# Patient Record
Sex: Male | Born: 1985 | Race: Asian | Hispanic: No | Marital: Single | Smoking: Never smoker
Health system: Southern US, Community
[De-identification: ages and names within clinical notes are randomized; demographics above are authoritative.]

## PROBLEM LIST (undated history)

## (undated) DIAGNOSIS — F32A Depression, unspecified: Secondary | ICD-10-CM

## (undated) DIAGNOSIS — T7840XA Allergy, unspecified, initial encounter: Secondary | ICD-10-CM

## (undated) DIAGNOSIS — F329 Major depressive disorder, single episode, unspecified: Secondary | ICD-10-CM

## (undated) HISTORY — DX: Allergy, unspecified, initial encounter: T78.40XA

## (undated) HISTORY — DX: Depression, unspecified: F32.A

## (undated) HISTORY — DX: Major depressive disorder, single episode, unspecified: F32.9

---
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
## 2009-11-23 ENCOUNTER — Emergency Department (HOSPITAL_COMMUNITY): Admission: EM | Admit: 2009-11-23 | Discharge: 2009-11-23 | Payer: Self-pay | Admitting: Emergency Medicine

## 2012-08-27 ENCOUNTER — Emergency Department (HOSPITAL_COMMUNITY)
Admission: EM | Admit: 2012-08-27 | Discharge: 2012-08-27 | Discharge status: Against Medical Advice | Payer: BC Managed Care – PPO | Attending: Emergency Medicine | Admitting: Emergency Medicine

## 2012-08-27 ENCOUNTER — Encounter (HOSPITAL_COMMUNITY): Payer: Self-pay | Admitting: Emergency Medicine

## 2012-08-27 DIAGNOSIS — F3289 Other specified depressive episodes: Secondary | ICD-10-CM | POA: Insufficient documentation

## 2012-08-27 DIAGNOSIS — F329 Major depressive disorder, single episode, unspecified: Secondary | ICD-10-CM | POA: Insufficient documentation

## 2012-08-27 DIAGNOSIS — F32A Depression, unspecified: Secondary | ICD-10-CM

## 2012-08-27 LAB — COMPREHENSIVE METABOLIC PANEL
ALT: 35 U/L (ref 0–53)
AST: 24 U/L (ref 0–37)
Albumin: 4.7 g/dL (ref 3.5–5.2)
Alkaline Phosphatase: 70 U/L (ref 39–117)
BUN: 13 mg/dL (ref 6–23)
CO2: 28 mEq/L (ref 19–32)
Calcium: 9.8 mg/dL (ref 8.4–10.5)
Chloride: 100 mEq/L (ref 96–112)
Creatinine, Ser: 0.88 mg/dL (ref 0.50–1.35)
GFR calc Af Amer: >90 mL/min (ref >90)
GFR calc non Af Amer: >90 mL/min (ref >90)
Glucose, Bld: 99 mg/dL (ref 70–99)
Potassium: 3.9 mEq/L (ref 3.5–5.1)
Sodium: 139 mEq/L (ref 135–145)
Total Bilirubin: 0.4 mg/dL (ref 0.3–1.2)
Total Protein: 8.3 g/dL (ref 6.0–8.3)

## 2012-08-27 LAB — ACETAMINOPHEN LEVEL: Acetaminophen (Tylenol), Serum: <15 ug/mL (ref 10–30)

## 2012-08-27 LAB — CBC
HCT: 43 % (ref 39.0–52.0)
Hemoglobin: 14.9 g/dL (ref 13.0–17.0)
MCH: 28.4 pg (ref 26.0–34.0)
MCHC: 34.7 g/dL (ref 30.0–36.0)
MCV: 81.9 fL (ref 78.0–100.0)
Platelets: 219 10*3/uL (ref 150–400)
RBC: 5.25 MIL/uL (ref 4.22–5.81)
RDW: 12.3 % (ref 11.5–15.5)
WBC: 6 10*3/uL (ref 4.0–10.5)

## 2012-08-27 LAB — RAPID URINE DRUG SCREEN, HOSP PERFORMED
Amphetamines: NOT DETECTED
Barbiturates: NOT DETECTED
Benzodiazepines: NOT DETECTED
Cocaine: NOT DETECTED
Opiates: NOT DETECTED
Tetrahydrocannabinol: NOT DETECTED

## 2012-08-27 LAB — ETHANOL: Alcohol, Ethyl (B): <11 mg/dL (ref 0–11)

## 2012-08-27 LAB — SALICYLATE LEVEL: Salicylate Lvl: <2 mg/dL — ABNORMAL LOW (ref 2.8–20.0)

## 2012-08-27 MED ORDER — ACETAMINOPHEN 325 MG PO TABS: 650.0000 mg | ORAL_TABLET | ORAL | Status: DC | PRN

## 2012-08-27 MED ORDER — ONDANSETRON HCL 4 MG PO TABS: 4.0000 mg | ORAL_TABLET | Freq: Three times a day (TID) | ORAL | Status: DC | PRN

## 2012-08-27 MED ORDER — ALUM & MAG HYDROXIDE-SIMETH 200-200-20 MG/5ML PO SUSP: 30.0000 mL | ORAL | Status: DC | PRN

## 2012-08-27 MED ORDER — LORAZEPAM 1 MG PO TABS: 1.0000 mg | ORAL_TABLET | Freq: Three times a day (TID) | ORAL | Status: DC | PRN

## 2012-08-27 MED ORDER — ZOLPIDEM TARTRATE 5 MG PO TABS: 5.0000 mg | ORAL_TABLET | Freq: Every evening | ORAL | Status: DC | PRN

## 2012-08-27 MED ORDER — IBUPROFEN 600 MG PO TABS
600.0000 mg | ORAL_TABLET | Freq: Three times a day (TID) | ORAL | Status: DC | PRN
  Administered 2012-08-27: 600 mg via ORAL
  Filled 2012-08-27: qty 1

## 2012-08-27 NOTE — BH Assessment (Signed)
Assessment Note   Bruce Carlson is an 26 y.o. male.   Pt reports feelings of depression and wanting to release frustrations with life stressors through crying and being unable to do so.  Pt became angry last night after drinking a "few heniken beers" and hit head against wall a few time.  Pt reports "I was just trying to feel something to take away some of the numb feelings I was having."  Pt reports "It was stupid to do.  I got a kid.  I work.  I don't want to hurt myself."  When asked specifically about reports from girlfriend that pt was making SI related remarks, pt said "I said what I said but did not mean it.  I just have stress that comes from trying to get by.  The last thing I would do to my kid is hurt myself."    Pt made good eye contact, english was good, pt verbalized understanding assessor, Ox3, speech clear, mild anxiety, affect appeared appropriate, judgement currently does not appear to be unimpaired, pt is on no medications and has no hx of optx or inptx or prior SI related attempts.  Tele Psych recommended to provide physician with psychiatric recommendation.  ACT recommends pt may be discharged with follow up to [facility] for optx to work thru life stressors.  While pt appears depressed and engaged in difficult to define behavior prior to the ED he is not exhibiting any behaviors currently that indicate he needs to be IVC and since pt wants to leave ED with f/u info ACT recommends d/c.  Tele Psych pending  Axis I: Mood Disorder NOS Axis II: Deferred Axis III: History reviewed. No pertinent past medical history. Axis IV: other psychosocial or environmental problems, problems related to social environment and problems with primary support group Axis V: 41-50 serious symptoms  Past Medical History: History reviewed. No pertinent past medical history.  History reviewed. No pertinent past surgical history.  Family History: No family history on file.  Social History:  reports that  he has never smoked. He does not have any smokeless tobacco history on file. He reports that he does not drink alcohol or use illicit drugs.  Additional Social History:  Alcohol / Drug Use Pain Medications: na Prescriptions: na Over the Counter: na History of alcohol / drug use?: Yes Substance #1 Name of Substance 1: alcohol 1 - Age of First Use: teen 1 - Amount (size/oz): 2 beers 1 - Frequency: 2x per week or so 1 - Duration: ongoing 1 - Last Use / Amount: 08-27-12  CIWA: CIWA-Ar BP: 109/73 mmHg Pulse Rate: 79  COWS:    Allergies: No Known Allergies  Home Medications:  (Not in a hospital admission)  OB/GYN Status:  No LMP for male patient.  General Assessment Data Location of Assessment: [facility] Living Arrangements: Alone Can pt return to current living arrangement?: Yes Admission Status: Voluntary Is patient capable of signing voluntary admission?: Yes Transfer from: Acute Hospital Referral Source: MD  Education Status Is patient currently in school?: No  Risk to self Suicidal Ideation: No Suicidal Intent: No Is patient at risk for suicide?: No Suicidal Plan?: No Access to Means: No What has been your use of drugs/alcohol within the last 12 months?: no Previous Attempts/Gestures: No How many times?: 0  Other Self Harm Risks: no Triggers for Past Attempts: None known Intentional Self Injurious Behavior: None Family Suicide History: No Recent stressful life event(s): Conflict (Comment);Other (Comment) (money issues, life stressors)  Persecutory voices/beliefs?: No Depression: Yes Depression Symptoms: Feeling angry/irritable Substance abuse history and/or treatment for substance abuse?: No Suicide prevention information given to non-admitted patients: Yes  Risk to Others Homicidal Ideation: No Thoughts of Harm to Others: No Current Homicidal Intent: No Current Homicidal Plan: No Access to Homicidal Means: No Identified Victim: none History of harm to  others?: No Assessment of Violence: None Noted Violent Behavior Description: cooperative and calm Does patient have access to weapons?: No Criminal Charges Pending?: No Does patient have a court date: No  Psychosis Hallucinations: None noted Delusions: None noted  Mental Status Report Appear/Hygiene: Other (Comment) (appropriate) Eye Contact: Good Motor Activity: Unremarkable Speech: Logical/coherent Level of Consciousness: Alert Mood: Depressed;Anxious Affect: Preoccupied;Depressed Anxiety Level: Minimal Thought Processes: Coherent Judgement: Unimpaired Orientation: Person;Place;Time;Situation Obsessive Compulsive Thoughts/Behaviors: None  Cognitive Functioning Concentration: Decreased Memory: Recent Intact;Remote Intact IQ: Average Insight: Fair Impulse Control: Fair Appetite: Fair Weight Loss: 0  Weight Gain: 0  Sleep: Decreased Total Hours of Sleep: 4  Vegetative Symptoms: None  ADLScreening [facility]) Patient's cognitive ability adequate to safely complete daily activities?: Yes Patient able to express need for assistance with ADLs?: Yes Independently performs ADLs?: Yes (appropriate for developmental age)  Abuse/Neglect [facility]) Physical Abuse: Denies Verbal Abuse: Denies Sexual Abuse: Denies  Prior Inpatient Therapy Prior Inpatient Therapy: No Prior Therapy Dates: na Prior Therapy Facilty/Provider(s): na Reason for Treatment: na  Prior Outpatient Therapy Prior Outpatient Therapy: No Prior Therapy Dates: na Prior Therapy Facilty/Provider(s): na Reason for Treatment: na  ADL Screening (condition at time of admission) Patient's cognitive ability adequate to safely complete daily activities?: Yes Patient able to express need for assistance with ADLs?: Yes Independently performs ADLs?: Yes (appropriate for developmental age) Weakness of Legs: None Weakness of Arms/Hands: None  Home Assistive Devices/Equipment Home Assistive  Devices/Equipment: None  Therapy Consults (therapy consults require a physician order) PT Evaluation Needed: No OT Evalulation Needed: No SLP Evaluation Needed: No Abuse/Neglect Assessment (Assessment to be complete while patient is alone) Physical Abuse: Denies Verbal Abuse: Denies Sexual Abuse: Denies Exploitation of patient/patient's resources: Denies Self-Neglect: Denies Values / Beliefs Cultural Requests During Hospitalization: None Spiritual Requests During Hospitalization: None Consults Spiritual Care Consult Needed: No Social Work Consult Needed: No Merchant navy officer (For Healthcare) Advance Directive: Patient does not have advance directive Pre-existing out of facility DNR order (yellow form or pink MOST form): No Nutrition Screen- [facility] Have you recently lost weight without trying?: No Have you been eating poorly because of a decreased appetite?: No Malnutrition Screening Tool Score: 0   Additional Information 1:1 In Past 12 Months?: No CIRT Risk: No Elopement Risk: No Does patient have medical clearance?: Yes     Disposition:   Referred to [facility] if Tele psych agrees pt can be discharged home.  Info given to pt  Disposition Disposition of Patient: Referred to (outpatient talk therapy per ACT recommendation) Patient referred to: Other (Comment) ([facility])  On Site Evaluation by:   Reviewed with Physician:     Titus Mould, Eppie Gibson 08/27/2012 2:53 PM

## 2012-08-27 NOTE — ED Notes (Signed)
Pt presenting to ed with c/o "I was hitting my head on the wall this morning and my girlfriend said that I passed out on the floor" pt states "I'm just not happy with life". Per ems pt was banging his head on the wall and the dry wall had 3 holes in it. Per ems pt's girlfriend said he threatened to kill himself. Pt with no obvious injuries noted. Pt with c/o headache pain. Pt denies HI at this time. Pt admits to trying to hurt himself.

## 2012-08-27 NOTE — Progress Notes (Signed)
ACT spoke to girlfriend [place].  ACT did not disclose any confidential information.  GF did not perceive pt to be a threat to self or others.  She recommends pt get counseling.  She confirmed pt has no hx of SI or this type of behavior.  She reports being with pt in relationship for 2 yrs.  Miranda can be reached at [phone].  Miranda reports that neither she nor his child or anyone else is in danger due to pt nor does she believe the pt will be a danger to self.

## 2012-08-27 NOTE — Progress Notes (Signed)
Patient states he can not stay here any longer he has things he has to do. Notified MD and appropriate AMA paper work signed.

## 2012-08-27 NOTE — ED Notes (Signed)
Girl friend placed patients house key, drivers licence, credit cards (no cash) and cell phone in pt belonging bag.  Witnessed by R. Hopkins NT

## 2012-08-30 NOTE — ED Provider Notes (Signed)
History    26 year old male with anxiety. Patient states increased stress or financial issues and has been being in his head against a wall as a means to cope with his happiness. Patient states that he is depressed, but he denies suicidal ideation. Patient states that he is a small child he would not kill himself. No homicidal ideation. Denies any drug use aside from occasional marijuana. Denies any acute ingestion. No hallucinations. No previous psychiatric evaluations or hospitalization.  CSN: [id number]  Arrival date & time 08/27/12  1051   First MD Initiated Contact with Patient 08/27/12 1151      Chief Complaint  Patient presents with  . Medical Clearance    (Consider location/radiation/quality/duration/timing/severity/associated sxs/prior treatment) HPI  History reviewed. No pertinent past medical history.  History reviewed. No pertinent past surgical history.  No family history on file.  History  Substance Use Topics  . Smoking status: Never Smoker   . Smokeless tobacco: Not on file  . Alcohol Use: No      Review of Systems  All systems reviewed and negative, other than as noted in HPI.   Allergies  Review of patient's allergies indicates no known allergies.  Home Medications  No current outpatient prescriptions on file.  BP 109/73  Pulse 79  Temp 98.4 F (36.9 C) (Oral)  Resp 20  SpO2 97%  Physical Exam  Nursing note and vitals reviewed. Constitutional: He appears well-developed and well-nourished. No distress.  HENT:  Head: Normocephalic and atraumatic.  Eyes: Conjunctivae normal are normal. Pupils are equal, round, and reactive to light. Right eye exhibits no discharge. Left eye exhibits no discharge.  Neck: Neck supple.  Cardiovascular: Normal rate, regular rhythm and normal heart sounds.  Exam reveals no gallop and no friction rub.   No murmur heard. Pulmonary/Chest: Effort normal and breath sounds normal. No respiratory distress.  Abdominal:  Soft. He exhibits no distension. There is no tenderness.  Musculoskeletal: He exhibits no edema and no tenderness.          Neurological: He is alert.  Skin: Skin is warm and dry.  Psychiatric: His behavior is normal. Thought content normal.       Speech is clear and content is appropriate. Does not appear to be responding to internal stimuli. Does not appear to be cognitively.    ED Course  Procedures (including critical care time)  Labs Reviewed  SALICYLATE LEVEL - Abnormal; Notable for the following:    Salicylate Lvl <2.0 (*)     All other components within normal limits  ACETAMINOPHEN LEVEL  CBC  COMPREHENSIVE METABOLIC PANEL  ETHANOL  URINE RAPID DRUG SCREEN (HOSP PERFORMED)  LAB REPORT - SCANNED   No results found.   1. Depression       MDM  26 year old male with depression. Patient initially agreeable to evaluation but then subsequently went to leave. Encouraged to stay but he is declining. Patient has no suicidality. No homicidal ideation. No evidence of psychosis. He has medical decision-making capability. He is leaving AGAINST MEDICAL ADVICE.        Raeford Razor, MD 08/30/12 [phone]

## 2013-01-03 ENCOUNTER — Ambulatory Visit (INDEPENDENT_AMBULATORY_CARE_PROVIDER_SITE_OTHER): Payer: BC Managed Care – PPO | Admitting: Family Medicine

## 2013-01-03 VITALS — BP 130/82 | HR 80 | Temp 97.8°F | Resp 18 | Ht 65.0 in | Wt 151.0 lb

## 2013-01-03 DIAGNOSIS — J302 Other seasonal allergic rhinitis: Secondary | ICD-10-CM

## 2013-01-03 DIAGNOSIS — Z0000 Encounter for general adult medical examination without abnormal findings: Secondary | ICD-10-CM

## 2013-01-03 DIAGNOSIS — J309 Allergic rhinitis, unspecified: Secondary | ICD-10-CM

## 2013-01-03 LAB — POCT URINALYSIS DIPSTICK
Bilirubin, UA: NEGATIVE
Glucose, UA: NEGATIVE
Ketones, UA: NEGATIVE
Leukocytes, UA: NEGATIVE
Nitrite, UA: NEGATIVE
Protein, UA: NEGATIVE
Spec Grav, UA: 1.01
Urobilinogen, UA: 0.2
pH, UA: 6.5

## 2013-01-03 LAB — POCT CBC
Granulocyte percent: 65.7 %G (ref 37–80)
HCT, POC: 44.5 % (ref 43.5–53.7)
Hemoglobin: 14 g/dL — AB (ref 14.1–18.1)
Lymph, poc: 2 (ref 0.6–3.4)
MCH, POC: 27.4 pg (ref 27–31.2)
MCHC: 31.5 g/dL — AB (ref 31.8–35.4)
MCV: 87 fL (ref 80–97)
MID (cbc): 0.7 (ref 0–0.9)
MPV: 9 fL (ref 0–99.8)
POC Granulocyte: 5.2 (ref 2–6.9)
POC LYMPH PERCENT: 25.9 %L (ref 10–50)
POC MID %: 8.4 %M (ref 0–12)
Platelet Count, POC: 259 10*3/uL (ref 142–424)
RBC: 5.11 M/uL (ref 4.69–6.13)
RDW, POC: 13.6 %
WBC: 7.9 10*3/uL (ref 4.6–10.2)

## 2013-01-03 LAB — COMPREHENSIVE METABOLIC PANEL
ALT: 51 U/L (ref 0–53)
AST: 97 U/L — ABNORMAL HIGH (ref 0–37)
Albumin: 5.4 g/dL — ABNORMAL HIGH (ref 3.5–5.2)
Alkaline Phosphatase: 55 U/L (ref 39–117)
BUN: 12 mg/dL (ref 6–23)
CO2: 29 mEq/L (ref 19–32)
Calcium: 10.3 mg/dL (ref 8.4–10.5)
Chloride: 102 mEq/L (ref 96–112)
Creat: 0.98 mg/dL (ref 0.50–1.35)
Glucose, Bld: 82 mg/dL (ref 70–99)
Potassium: 4.5 mEq/L (ref 3.5–5.3)
Sodium: 139 mEq/L (ref 135–145)
Total Bilirubin: 0.4 mg/dL (ref 0.3–1.2)
Total Protein: 8.5 g/dL — ABNORMAL HIGH (ref 6.0–8.3)

## 2013-01-03 LAB — LIPID PANEL
Cholesterol: 224 mg/dL — ABNORMAL HIGH (ref 0–200)
HDL: 57 mg/dL (ref >39)
LDL Cholesterol: 143 mg/dL — ABNORMAL HIGH (ref 0–99)
Total CHOL/HDL Ratio: 3.9 Ratio
Triglycerides: 122 mg/dL (ref <150)
VLDL: 24 mg/dL (ref 0–40)

## 2013-01-03 MED ORDER — PREDNISONE 20 MG PO TABS: 20.0000 mg | ORAL_TABLET | Freq: Two times a day (BID) | ORAL | Status: DC

## 2013-01-03 NOTE — Patient Instructions (Addendum)
Health Maintenance, Males A healthy lifestyle and preventative care can promote health and wellness.  Maintain regular health, dental, and eye exams.  Eat a healthy diet. Foods like vegetables, fruits, whole grains, low-fat dairy products, and lean protein foods contain the nutrients you need without too many calories. Decrease your intake of foods high in solid fats, added sugars, and salt. Get information about a proper diet from your caregiver, if necessary.  Regular physical exercise is one of the most important things you can do for your health. Most adults should get at least 150 minutes of moderate-intensity exercise (any activity that increases your heart rate and causes you to sweat) each week. In addition, most adults need muscle-strengthening exercises on 2 or more days a week.   Maintain a healthy weight. The body mass index (BMI) is a screening tool to identify possible weight problems. It provides an estimate of body fat based on height and weight. Your caregiver can help determine your BMI, and can help you achieve or maintain a healthy weight. For adults 20 years and older:  A BMI below 18.5 is considered underweight.  A BMI of 18.5 to 24.9 is normal.  A BMI of 25 to 29.9 is considered overweight.  A BMI of 30 and above is considered obese.  Maintain normal blood lipids and cholesterol by exercising and minimizing your intake of saturated fat. Eat a balanced diet with plenty of fruits and vegetables. Blood tests for lipids and cholesterol should begin at age 20 and be repeated every 5 years. If your lipid or cholesterol levels are high, you are over 50, or you are a high risk for heart disease, you may need your cholesterol levels checked more frequently.Ongoing high lipid and cholesterol levels should be treated with medicines, if diet and exercise are not effective.  If you smoke, find out from your caregiver how to quit. If you do not use tobacco, do not start.  If you  choose to drink alcohol, do not exceed 2 drinks per day. One drink is considered to be 12 ounces (355 mL) of beer, 5 ounces (148 mL) of wine, or 1.5 ounces (44 mL) of liquor.  Avoid use of street drugs. Do not share needles with anyone. Ask for help if you need support or instructions about stopping the use of drugs.  High blood pressure causes heart disease and increases the risk of stroke. Blood pressure should be checked at least every 1 to 2 years. Ongoing high blood pressure should be treated with medicines if weight loss and exercise are not effective.  If you are 45 to 29 years old, ask your caregiver if you should take aspirin to prevent heart disease.  Diabetes screening involves taking a blood sample to check your fasting blood sugar level. This should be done once every 3 years, after age 45, if you are within normal weight and without risk factors for diabetes. Testing should be considered at a younger age or be carried out more frequently if you are overweight and have at least 1 risk factor for diabetes.  Colorectal cancer can be detected and often prevented. Most routine colorectal cancer screening begins at the age of 50 and continues through age 75. However, your caregiver may recommend screening at an earlier age if you have risk factors for colon cancer. On a yearly basis, your caregiver may provide home test kits to check for hidden blood in the stool. Use of a small camera at the end of a tube,   to directly examine the colon (sigmoidoscopy or colonoscopy), can detect the earliest forms of colorectal cancer. Talk to your caregiver about this at age 50, when routine screening begins. Direct examination of the colon should be repeated every 5 to 10 years through age 75, unless early forms of pre-cancerous polyps or small growths are found.  Hepatitis C blood testing is recommended for all people born from 1945 through 1965 and any individual with known risks for hepatitis C.  Healthy  men should no longer receive prostate-specific antigen (PSA) blood tests as part of routine cancer screening. Consult with your caregiver about prostate cancer screening.  Testicular cancer screening is not recommended for adolescents or adult males who have no symptoms. Screening includes self-exam, caregiver exam, and other screening tests. Consult with your caregiver about any symptoms you have or any concerns you have about testicular cancer.  Practice safe sex. Use condoms and avoid high-risk sexual practices to reduce the spread of sexually transmitted infections (STIs).  Use sunscreen with a sun protection factor (SPF) of 30 or greater. Apply sunscreen liberally and repeatedly throughout the day. You should seek shade when your shadow is shorter than you. Protect yourself by wearing long sleeves, pants, a wide-brimmed hat, and sunglasses year round, whenever you are outdoors.  Notify your caregiver of new moles or changes in moles, especially if there is a change in shape or color. Also notify your caregiver if a mole is larger than the size of a pencil eraser.  A one-time screening for abdominal aortic aneurysm (AAA) and surgical repair of large AAAs by sound wave imaging (ultrasonography) is recommended for ages 65 to 75 years who are current or former smokers.  Stay current with your immunizations. Document Released: 02/19/2008 Document Revised: 11/15/2011 Document Reviewed: 01/18/2011 Exit[employer] Patient Information 2013 [employer].  

## 2013-01-03 NOTE — Progress Notes (Signed)
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  Has h/o seasonal allergies.  Rx:  claritin D, Zyrtec no help  Sig Negs:  Occasional low grade fever.  Ear pain.    Patient ID: Clayburn Weekly MRN: [id number], DOB: 1986/02/29 27 y.o. Date of Encounter: 01/03/2013, 2:20 PM  Primary Physician: No PCP Per Patient  Chief Complaint: Physical (CPE)  HPI: 27 y.o. y/o male with history noted below here for CPE.  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  Has h/o seasonal allergies.  Rx:  claritin D, Zyrtec no help  Sig Negs:  Occasional low grade fever.  Ear pain.  Review of Systems: Consitutional: No fever, chills, fatigue, night sweats, lymphadenopathy, or weight changes. Eyes: No visual changes, eye redness, or discharge. ENT/Mouth: Ears: No otalgia, tinnitus, hearing loss, discharge. Nose: No congestion, rhinorrhea, sinus pain, or epistaxis. Throat: No sore throat, post nasal drip, or teeth pain. Cardiovascular: No CP, palpitations, diaphoresis, DOE, edema, orthopnea, PND. Respiratory: No cough, hemoptysis, SOB, or wheezing. Gastrointestinal: No anorexia, dysphagia, reflux, pain, nausea, vomiting, hematemesis, diarrhea, constipation, BRBPR, or melena. Genitourinary: No dysuria, frequency, urgency, hematuria, incontinence, nocturia, decreased urinary stream, discharge, impotence, or testicular pain/masses. Musculoskeletal: No decreased ROM, myalgias, stiffness, joint swelling, or weakness. Skin: No rash, erythema, lesion changes, pain, warmth, jaundice, or pruritis. Neurological: No headache, dizziness, syncope, seizures, tremors, memory loss, coordination problems, or paresthesias. Psychological: No anxiety, depression, hallucinations, SI/HI. Endocrine: No fatigue, polydipsia, polyphagia, polyuria, or known diabetes. All other systems were reviewed and are otherwise negative.  Past Medical History  Diagnosis Date  . Allergy   . Depression      History  reviewed. No pertinent past surgical history.  Home Meds:  Prior to Admission medications   Not on File    Allergies: No Known Allergies  History   Social History  . Marital Status: Single    Spouse Name: N/A    Number of Children: N/A  . Years of Education: N/A   Occupational History  . Not on file.   Social History Main Topics  . Smoking status: Never Smoker   . Smokeless tobacco: Not on file  . Alcohol Use: Yes  . Drug Use: No  . Sexually Active: Yes   Other Topics Concern  . Not on file   Social History Narrative  . No narrative on file    Family History  Problem Relation Age of Onset  . Hypertension Mother   . Hypertension Father   . Stroke Father     Physical Exam: Blood pressure 130/82, pulse 80, temperature 97.8 F (36.6 C), temperature source Oral, resp. rate 18, height 5\' 5"  (1.651 m), weight 151 lb (68.493 kg), SpO2 100.00%.  General: Well developed, well nourished, in no acute distress. HEENT: Normocephalic, atraumatic. Conjunctiva pink, sclera non-icteric. Pupils 2 mm constricting to 1 mm, round, regular, and equally reactive to light and accomodation. EOMI. Internal auditory canal clear. TMs with good cone of light and without pathology. Nasal mucosa pink. Nares are without discharge. No sinus tenderness. Oral mucosa pink. Dentition good. Pharynx without exudate.   Neck: Supple. Trachea midline. No thyromegaly. Full ROM. No lymphadenopathy. Lungs: Clear to auscultation bilaterally without wheezes, rales, or rhonchi. Breathing is of normal effort and unlabored. Cardiovascular: RRR with S1 S2. No murmurs, rubs, or gallops appreciated. Distal pulses 2+ symmetrically. No carotid or abdominal bruits Abdomen: Soft, non-tender, non-distended with normoactive bowel sounds. No hepatosplenomegaly or masses. No rebound/guarding. No CVA tenderness.  Without hernias.   Genitourinary:  uncircumcised male. No penile lesions. Testes descended bilaterally, and smooth  without tenderness or masses.  Musculoskeletal: Full range of motion and 5/5 strength throughout. Without swelling, atrophy, tenderness, crepitus, or warmth. Extremities without clubbing, cyanosis, or edema. Calves supple. Skin: Warm and moist without erythema, ecchymosis, wounds, or rash.  Some dyshydrosis on palms.  Multiple 2-3 mm nevi on back without irregularity of shape, contour, or colour. Neuro: A+Ox3. CN II-XII grossly intact. Moves all extremities spontaneously. Full sensation throughout. Normal gait. DTR 2+ throughout upper and lower extremities. Finger to nose intact. Psych:  Responds to questions appropriately with a normal affect.    UA:   Assessment/Plan:  27 y.o. y/o  male here for CPE -Routine general medical examination at a health care facility - Plan: POCT CBC, POCT urinalysis dipstick, Comprehensive metabolic panel, Lipid panel  Seasonal allergies - Plan: predniSONE (DELTASONE) 20 MG tablet    Signed, Elvina Sidle, MD 01/03/2013 2:20 PM

## 2013-01-04 ENCOUNTER — Telehealth: Payer: Self-pay

## 2013-01-04 NOTE — Telephone Encounter (Signed)
Pt's girlfriend is calling because she said she had a missed call but no voicemail Call back number is [phone] She is on the HIPPA form

## 2013-01-05 NOTE — Telephone Encounter (Signed)
See lab/ we can not speak to her. Unable to leave voicemail

## 2013-02-09 ENCOUNTER — Ambulatory Visit: Payer: BC Managed Care – PPO

## 2013-02-09 ENCOUNTER — Other Ambulatory Visit: Payer: Self-pay | Admitting: Family Medicine

## 2013-02-09 ENCOUNTER — Ambulatory Visit (INDEPENDENT_AMBULATORY_CARE_PROVIDER_SITE_OTHER): Payer: BC Managed Care – PPO | Admitting: Family Medicine

## 2013-02-09 VITALS — BP 124/72 | HR 79 | Temp 98.9°F | Resp 16 | Ht 65.0 in | Wt 139.4 lb

## 2013-02-09 DIAGNOSIS — R634 Abnormal weight loss: Secondary | ICD-10-CM

## 2013-02-09 DIAGNOSIS — M549 Dorsalgia, unspecified: Secondary | ICD-10-CM

## 2013-02-09 DIAGNOSIS — F418 Other specified anxiety disorders: Secondary | ICD-10-CM

## 2013-02-09 DIAGNOSIS — R4589 Other symptoms and signs involving emotional state: Secondary | ICD-10-CM

## 2013-02-09 DIAGNOSIS — R319 Hematuria, unspecified: Secondary | ICD-10-CM

## 2013-02-09 DIAGNOSIS — F341 Dysthymic disorder: Secondary | ICD-10-CM

## 2013-02-09 DIAGNOSIS — R51 Headache: Secondary | ICD-10-CM

## 2013-02-09 DIAGNOSIS — R10A0 Flank pain, unspecified side: Secondary | ICD-10-CM

## 2013-02-09 DIAGNOSIS — R109 Unspecified abdominal pain: Secondary | ICD-10-CM

## 2013-02-09 DIAGNOSIS — R079 Chest pain, unspecified: Secondary | ICD-10-CM

## 2013-02-09 DIAGNOSIS — R454 Irritability and anger: Secondary | ICD-10-CM

## 2013-02-09 DIAGNOSIS — R350 Frequency of micturition: Secondary | ICD-10-CM

## 2013-02-09 LAB — POCT CBC
HCT, POC: 47.2 % (ref 43.5–53.7)
Hemoglobin: 14.8 g/dL (ref 14.1–18.1)
Lymph, poc: 1.4 (ref 0.6–3.4)
MCHC: 31.4 g/dL — AB (ref 31.8–35.4)
MCV: 90.1 fL (ref 80–97)
POC Granulocyte: 3 (ref 2–6.9)
POC LYMPH PERCENT: 30.6 %L (ref 10–50)
RDW, POC: 12.8 %
WBC: 4.7 10*3/uL (ref 4.6–10.2)

## 2013-02-09 LAB — COMPREHENSIVE METABOLIC PANEL
AST: 15 U/L (ref 0–37)
Albumin: 4.7 g/dL (ref 3.5–5.2)
Alkaline Phosphatase: 57 U/L (ref 39–117)
BUN: 6 mg/dL (ref 6–23)
Calcium: 9.8 mg/dL (ref 8.4–10.5)
Chloride: 102 mEq/L (ref 96–112)
Glucose, Bld: 99 mg/dL (ref 70–99)
Potassium: 3.8 mEq/L (ref 3.5–5.3)
Sodium: 139 mEq/L (ref 135–145)
Total Protein: 7.5 g/dL (ref 6.0–8.3)

## 2013-02-09 LAB — POCT UA - MICROSCOPIC ONLY
Casts, Ur, LPF, POC: NEGATIVE
Crystals, Ur, HPF, POC: NEGATIVE
Yeast, UA: NEGATIVE

## 2013-02-09 LAB — POCT URINALYSIS DIPSTICK
Bilirubin, UA: NEGATIVE
Blood, UA: NEGATIVE
Leukocytes, UA: NEGATIVE
Nitrite, UA: NEGATIVE
Protein, UA: NEGATIVE
Urobilinogen, UA: 1
pH, UA: 8.5

## 2013-02-09 MED ORDER — CIPROFLOXACIN HCL 500 MG PO TABS: 500.0000 mg | ORAL_TABLET | Freq: Two times a day (BID) | ORAL | Status: DC

## 2013-02-09 MED ORDER — HYDROCODONE-ACETAMINOPHEN 5-325 MG PO TABS: 1.0000 | ORAL_TABLET | Freq: Four times a day (QID) | ORAL | Status: DC | PRN

## 2013-02-09 MED ORDER — CLONAZEPAM 0.5 MG PO TABS: 0.5000 mg | ORAL_TABLET | Freq: Two times a day (BID) | ORAL | Status: DC | PRN

## 2013-02-09 NOTE — Patient Instructions (Addendum)
Call counselor for appointment: Bruce Carlson: [phone] Bruce Carlson: [phone].  You can take the clonazepam for anxiety/stress up to twice per day.  If this worsens, or any suicide thoughts - call 911, or go to the emergency room.  We can follow up to discuss this next week.   Increase fluids for possible kidney stone as cause of your flank pain.  You can take the pain medicine every 6 hours as needed. Urinate through the filter, and if any stone passed - bring it in to clinic. We will also start an antibiotic for now in case there is a urinary or prostate infection.  Recheck in 2 days, and if not improving then - may need CT scan to look for kidney stone. You should receive a call or letter about your lab results within the next week to 10 days.    Return to the clinic or go to the nearest emergency room if any of your symptoms worsen or new symptoms occur.
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
## 2013-02-11 LAB — URINE CULTURE
Colony Count: NO GROWTH
Organism ID, Bacteria: NO GROWTH

## 2013-02-12 LAB — T3: T3, Total: 96.3 ng/dL (ref 80.0–204.0)

## 2013-07-09 ENCOUNTER — Telehealth: Payer: Self-pay | Admitting: Radiology

## 2013-07-09 ENCOUNTER — Ambulatory Visit (HOSPITAL_COMMUNITY)
Admission: RE | Admit: 2013-07-09 | Discharge: 2013-07-09 | Disposition: A | Discharge status: Home / Self Care | Payer: BC Managed Care – PPO | Source: Ambulatory Visit | Attending: Emergency Medicine | Admitting: Emergency Medicine

## 2013-07-09 ENCOUNTER — Ambulatory Visit (INDEPENDENT_AMBULATORY_CARE_PROVIDER_SITE_OTHER): Payer: BC Managed Care – PPO | Admitting: Emergency Medicine

## 2013-07-09 VITALS — BP 99/68 | HR 78 | Temp 98.0°F | Resp 18 | Wt 148.0 lb

## 2013-07-09 DIAGNOSIS — S0990XA Unspecified injury of head, initial encounter: Secondary | ICD-10-CM

## 2013-07-09 DIAGNOSIS — H539 Unspecified visual disturbance: Secondary | ICD-10-CM | POA: Insufficient documentation

## 2013-07-09 DIAGNOSIS — R51 Headache: Secondary | ICD-10-CM

## 2013-07-09 MED ORDER — BUTALBITAL-APAP-CAFFEINE 50-500-40 MG PO TABS: 1.0000 | ORAL_TABLET | Freq: Four times a day (QID) | ORAL | Status: DC | PRN
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
## 2013-07-09 NOTE — Patient Instructions (Addendum)
Go to [facility], Register as an outpatient, and then go to the CT department now. [address] (the entrance is off [address]  General Headache Without Cause A headache is pain or discomfort felt around the head or neck area. The specific cause of a headache may not be found. There are many causes and types of headaches. A few common ones are:  Tension headaches.  Migraine headaches.  Cluster headaches.  Chronic daily headaches. HOME CARE INSTRUCTIONS   Keep all follow-up appointments with your caregiver or any specialist referral.  Only take over-the-counter or prescription medicines for pain or discomfort as directed by your caregiver.  Lie down in a dark, quiet room when you have a headache.  Keep a headache journal to find out what may trigger your migraine headaches. For example, write down:  What you eat and drink.  How much sleep you get.  Any change to your diet or medicines.  Try massage or other relaxation techniques.  Put ice packs or heat on the head and neck. Use these 3 to 4 times per day for 15 to 20 minutes each time, or as needed.  Limit stress.  Sit up straight, and do not tense your muscles.  Quit smoking if you smoke.  Limit alcohol use.  Decrease the amount of caffeine you drink, or stop drinking caffeine.  Eat and sleep on a regular schedule.  Get 7 to 9 hours of sleep, or as recommended by your caregiver.  Keep lights dim if bright lights bother you and make your headaches worse. SEEK MEDICAL CARE IF:   You have problems with the medicines you were prescribed.  Your medicines are not working.  You have a change from the usual headache.  You have nausea or vomiting. SEEK IMMEDIATE MEDICAL CARE IF:   Your headache becomes severe.  You have a fever.  You have a stiff neck.  You have loss of vision.  You have muscular weakness or loss of muscle control.  You start losing your balance or have  trouble walking.  You feel faint or pass out.  You have severe symptoms that are different from your first symptoms. MAKE SURE YOU:   Understand these instructions.  Will watch your condition.  Will get help right away if you are not doing well or get worse. Document Released: 08/23/2005 Document Revised: 11/15/2011 Document Reviewed: 09/08/2011 [facility] Patient Information 2014 [place], [place].

## 2013-07-09 NOTE — Telephone Encounter (Signed)
Patient advised CT scan of his head normal. He is to try the medications, if he needs anything else he will let us know.

## 2014-08-17 ENCOUNTER — Emergency Department (HOSPITAL_COMMUNITY)
Admission: EM | Admit: 2014-08-17 | Discharge: 2014-08-18 | Disposition: A | Discharge status: Home / Self Care | Payer: BC Managed Care – PPO | Attending: Emergency Medicine | Admitting: Emergency Medicine

## 2014-08-17 ENCOUNTER — Encounter (HOSPITAL_COMMUNITY): Payer: Self-pay | Admitting: Emergency Medicine

## 2014-08-17 ENCOUNTER — Emergency Department (HOSPITAL_COMMUNITY): Payer: BC Managed Care – PPO

## 2014-08-17 DIAGNOSIS — S3219XA Other fracture of sacrum, initial encounter for closed fracture: Secondary | ICD-10-CM | POA: Insufficient documentation

## 2014-08-17 DIAGNOSIS — Y998 Other external cause status: Secondary | ICD-10-CM | POA: Insufficient documentation

## 2014-08-17 DIAGNOSIS — Z8659 Personal history of other mental and behavioral disorders: Secondary | ICD-10-CM | POA: Insufficient documentation

## 2014-08-17 DIAGNOSIS — Y92524 Gas station as the place of occurrence of the external cause: Secondary | ICD-10-CM | POA: Insufficient documentation

## 2014-08-17 DIAGNOSIS — W010XXA Fall on same level from slipping, tripping and stumbling without subsequent striking against object, initial encounter: Secondary | ICD-10-CM | POA: Insufficient documentation

## 2014-08-17 DIAGNOSIS — Y9389 Activity, other specified: Secondary | ICD-10-CM | POA: Insufficient documentation

## 2014-08-17 DIAGNOSIS — W19XXXA Unspecified fall, initial encounter: Secondary | ICD-10-CM

## 2014-08-17 DIAGNOSIS — S3210XA Unspecified fracture of sacrum, initial encounter for closed fracture: Secondary | ICD-10-CM

## 2014-08-17 MED ORDER — HYDROCODONE-ACETAMINOPHEN 5-325 MG PO TABS
1.0000 | ORAL_TABLET | Freq: Once | ORAL | Status: AC
  Administered 2014-08-17: 1 via ORAL
  Filled 2014-08-17: qty 1

## 2014-08-17 MED ORDER — KETOROLAC TROMETHAMINE 30 MG/ML IJ SOLN
30.0000 mg | Freq: Once | INTRAMUSCULAR | Status: AC
  Administered 2014-08-17: 30 mg via INTRAMUSCULAR
  Filled 2014-08-17: qty 1

## 2014-08-17 MED ORDER — DIAZEPAM 5 MG PO TABS
5.0000 mg | ORAL_TABLET | Freq: Once | ORAL | Status: AC
  Administered 2014-08-17: 5 mg via ORAL
  Filled 2014-08-17: qty 1

## 2014-08-17 NOTE — ED Provider Notes (Signed)
CSN: [id number]     Arrival date & time 08/17/14  2155 History   First MD Initiated Contact with Patient 08/17/14 2253     This chart was scribed for non-physician practitioner, Earley FavorGail Mikaylee Arseneau, FNP working with Loren Raceravid Yelverton, MD by Arlan OrganAshley Leger, ED Scribe. This patient was seen in room WTR9/WTR9 and the patient's care was started at 12:14 AM.   Chief Complaint  Patient presents with  . Back Pain   The history is provided by the patient. No language interpreter was used.    HPI Comments: Bruce Carlson is a 28 y.o. male who presents to the Emergency Department complaining of constant, moderate back pain that radiates down both legs x 1 day. Pt states he was at the gym doing leg presses yesterday. He states afterwards he tripped over some free weights resulting in him falling to the ground. Pt states he also tripped over a bottle today landing on his bottom while at the gas station. [name] states he was unable to completely straighten his back earlier today secondary to pain. He has not tried any OTC medications but has applied topical toger bomb to the area without any improvement for symptoms. No recent fever or chills. No numbness, loss of sensation, or weakness. No known allergies to medications.  Past Medical History  Diagnosis Date  . Allergy   . Depression    History reviewed. No pertinent past surgical history. Family History  Problem Relation Age of Onset  . Hypertension Mother   . Hypertension Father   . Stroke Father    History  Substance Use Topics  . Smoking status: Never Smoker   . Smokeless tobacco: Not on file  . Alcohol Use: No    Review of Systems  Constitutional: Negative for fever and chills.  Musculoskeletal: Positive for back pain.  All other systems reviewed and are negative.     Allergies  Review of patient's allergies indicates no known allergies.  Home Medications   Prior to Admission medications   Medication Sig Start Date End Date Taking? Authorizing  Provider  butalbital-acetaminophen-caffeine (ESGIC PLUS) 50-500-40 MG per tablet Take 1 tablet by mouth every 6 (six) hours as needed for pain. 07/09/13   Collene GobbleSteven A Daub, MD  clonazePAM (KLONOPIN) 0.5 MG tablet Take 1 tablet (0.5 mg total) by mouth 2 (two) times daily as needed for anxiety. 02/09/13   Shade FloodJeffrey R Greene, MD  docusate sodium (COLACE) 100 MG capsule Take 1 capsule (100 mg total) by mouth every 12 (twelve) hours. 08/18/14   Arman FilterGail K Jaydenn Boccio, NP  HYDROcodone-acetaminophen (NORCO/VICODIN) 5-325 MG per tablet Take 1 tablet by mouth every 6 (six) hours as needed for moderate pain. 08/18/14   Arman FilterGail K Raylie Maddison, NP   Triage Vitals: BP 132/86 mmHg  Pulse 79  Temp(Src) 97.5 F (36.4 C) (Oral)  Resp 16  SpO2 95%   Physical Exam  Constitutional: He is oriented to person, place, and time. He appears well-developed and well-nourished.  HENT:  Head: Normocephalic.  Eyes: EOM are normal.  Neck: Normal range of motion.  Pulmonary/Chest: Effort normal.  Abdominal: He exhibits no distension.  Musculoskeletal: Normal range of motion.  Neurological: He is alert and oriented to person, place, and time.  Psychiatric: He has a normal mood and affect.  Nursing note and vitals reviewed.   ED Course  Procedures (including critical care time)  DIAGNOSTIC STUDIES: Oxygen Saturation is 95% on RA, adequate by my interpretation.    COORDINATION OF CARE: 12:14 AM-Discussed treatment  plan with pt at bedside and pt agreed to plan.     Labs Review Labs Reviewed - No data to display  Imaging Review Dg Lumbar Spine Complete  08/17/2014   CLINICAL DATA:  Moderate back pain radiating down both legs for 1 day, worse on the left side. Tripped and fall yesterday. Additional trip and fall today. Tail bone pain.  EXAM: LUMBAR SPINE - COMPLETE 4+ VIEW  COMPARISON:  None.  FINDINGS: There is no evidence of lumbar spine fracture. Mild convexity to the left is probably positional. Alignment is otherwise normal.  Intervertebral disc spaces are maintained.  IMPRESSION: Negative.   Electronically Signed   By: Burman NievesWilliam  Stevens M.D.   On: 08/17/2014 23:47   Dg Sacrum/coccyx  08/17/2014   CLINICAL DATA:  Moderate pain radiating to bilateral lower extremities for 1 day after doing leg presses at gym, tripped over free weight, fall. Tripped on bottle at gas station today.  EXAM: SACRUM AND COCCYX - 2+ VIEW  COMPARISON:  None.  FINDINGS: Linear lucency through the RIGHT sacrum. Slight buckling of the posterior S1 cortex. No neural foraminal expansion. Sacroiliac joints are symmetric. No destructive bony lesions. Soft tissue planes are nonsuspicious.  IMPRESSION: Possible nondisplaced RIGHT sacrum. Findings would be better assessed on cross-sectional imaging of the sacrum as clinically indicated.   Electronically Signed   By: Awilda Metroourtnay  Bloomer   On: 08/17/2014 23:49     EKG Interpretation None      MDM  Patient given Rx for Hydrocodone and Colace and instructed t use a donut for sitting  Final diagnoses:  Fall  Sacral fracture, closed, initial encounter       I personally performed the services described in this documentation, which was scribed in my presence. The recorded information has been reviewed and is accurate.    Arman FilterGail K Lylee Corrow, NP 08/18/14 [id number]  Loren Raceravid Yelverton, MD 08/19/14 [id number]

## 2014-08-17 NOTE — ED Notes (Signed)
Pt presents with low back pain onset yesterday. Pt states he tripped over free weights at the gym then today, tripped over bottle on the ground at a gas station, pt c/o severe low back pain radiating down both legs L more than right. Pt tearful  

## 2014-08-18 MED ORDER — DOCUSATE SODIUM 100 MG PO CAPS: 100.0000 mg | ORAL_CAPSULE | Freq: Two times a day (BID) | ORAL | Status: DC

## 2014-08-18 MED ORDER — HYDROCODONE-ACETAMINOPHEN 5-325 MG PO TABS: 1.0000 | ORAL_TABLET | Freq: Four times a day (QID) | ORAL | Status: DC | PRN

## 2014-08-18 NOTE — ED Notes (Signed)
Pt has a ride home.  

## 2014-08-18 NOTE — Discharge Instructions (Signed)
You have a fracture in the "tail bone" or coccyx this will heal over time  You have been given pain medication as well as a stool softer to allow ease of bowel movements  It will help if you get a "donut" pillow from the pharmacy to sit on

## 2014-12-23 ENCOUNTER — Ambulatory Visit (INDEPENDENT_AMBULATORY_CARE_PROVIDER_SITE_OTHER): Payer: BLUE CROSS/BLUE SHIELD | Admitting: Physician Assistant

## 2014-12-23 VITALS — BP 110/70 | HR 65 | Temp 97.6°F | Resp 18 | Ht 66.5 in | Wt 157.0 lb

## 2014-12-23 DIAGNOSIS — L309 Dermatitis, unspecified: Secondary | ICD-10-CM

## 2014-12-23 DIAGNOSIS — B029 Zoster without complications: Secondary | ICD-10-CM | POA: Diagnosis not present

## 2014-12-23 DIAGNOSIS — J309 Allergic rhinitis, unspecified: Secondary | ICD-10-CM | POA: Diagnosis not present

## 2014-12-23 MED ORDER — VALACYCLOVIR HCL 1 G PO TABS: 1000.0000 mg | ORAL_TABLET | Freq: Three times a day (TID) | ORAL | Status: DC

## 2014-12-23 MED ORDER — HYDROCORTISONE 2.5 % EX OINT: TOPICAL_OINTMENT | Freq: Two times a day (BID) | CUTANEOUS | Status: DC

## 2014-12-23 MED ORDER — FLUTICASONE PROPIONATE 50 MCG/ACT NA SUSP: 2.0000 | Freq: Every day | NASAL | Status: DC

## 2014-12-23 NOTE — Patient Instructions (Signed)
The rash on your back is most likely from shingles.  Please take the valacyclovir three times per day for 7 days.  For the allergies, please do 2 sprays of the flonase in each nostril once daily. For the eczema by your left ear, please apply the steroid topical twice daily for 2-3 weeks. Applying a moisturizer like eucerin or cetaphil 2-3 times per day will help as well.   Shingles Shingles (herpes zoster) is an infection that is caused by the same virus that causes chickenpox (varicella). The infection causes a painful skin rash and fluid-filled blisters, which eventually break open, crust over, and heal. It may occur in any area of the body, but it usually affects only one side of the body or face. The pain of shingles usually lasts about 1 month. However, some people with shingles may develop long-term (chronic) pain in the affected area of the body. Shingles often occurs many years after the person had chickenpox. It is more common:  In people older than 50 years.  In people with weakened immune systems, such as those with HIV, AIDS, or cancer.  In people taking medicines that weaken the immune system, such as transplant medicines.  In people under great stress. CAUSES  Shingles is caused by the varicella zoster virus (VZV), which also causes chickenpox. After a person is infected with the virus, it can remain in the person's body for years in an inactive state (dormant). To cause shingles, the virus reactivates and breaks out as an infection in a nerve root. The virus can be spread from person to person (contagious) through contact with open blisters of the shingles rash. It will only spread to people who have not had chickenpox. When these people are exposed to the virus, they may develop chickenpox. They will not develop shingles. Once the blisters scab over, the person is no longer contagious and cannot spread the virus to others. SIGNS AND SYMPTOMS  Shingles shows up in stages. The  initial symptoms may be pain, itching, and tingling in an area of the skin. This pain is usually described as burning, stabbing, or throbbing.In a few days or weeks, a painful red rash will appear in the area where the pain, itching, and tingling were felt. The rash is usually on one side of the body in a band or belt-like pattern. Then, the rash usually turns into fluid-filled blisters. They will scab over and dry up in approximately 2-3 weeks. Flu-like symptoms may also occur with the initial symptoms, the rash, or the blisters. These may include:  Fever.  Chills.  Headache.  Upset stomach. DIAGNOSIS  Your health care provider will perform a skin exam to diagnose shingles. Skin scrapings or fluid samples may also be taken from the blisters. This sample will be examined under a microscope or sent to a lab for further testing. TREATMENT  There is no specific cure for shingles. Your health care provider will likely prescribe medicines to help you manage the pain, recover faster, and avoid long-term problems. This may include antiviral drugs, anti-inflammatory drugs, and pain medicines. HOME CARE INSTRUCTIONS   Take a cool bath or apply cool compresses to the area of the rash or blisters as directed. This may help with the pain and itching.   Take medicines only as directed by your health care provider.   Rest as directed by your health care provider.  Keep your rash and blisters clean with mild soap and cool water or as directed by your health  care provider.  Do not pick your blisters or scratch your rash. Apply an anti-itch cream or numbing creams to the affected area as directed by your health care provider.  Keep your shingles rash covered with a loose bandage (dressing).  Avoid skin contact with:  Babies.   Pregnant women.   Children with eczema.   Elderly people with transplants.   People with chronic illnesses, such as leukemia or AIDS.   Wear loose-fitting  clothing to help ease the pain of material rubbing against the rash.  Keep all follow-up visits as directed by your health care provider.If the area involved is on your face, you may receive a referral for a specialist, such as an eye doctor (ophthalmologist) or an ear, nose, and throat (ENT) doctor. Keeping all follow-up visits will help you avoid eye problems, chronic pain, or disability.  SEEK IMMEDIATE MEDICAL CARE IF:   You have facial pain, pain around the eye area, or loss of feeling on one side of your face.  You have ear pain or ringing in your ear.  You have loss of taste.  Your pain is not relieved with prescribed medicines.   Your redness or swelling spreads.   You have more pain and swelling.  Your condition is worsening or has changed.   You have a fever. MAKE SURE YOU:  Understand these instructions.  Will watch your condition.  Will get help right away if you are not doing well or get worse. Document Released: 08/23/2005 Document Revised: 01/07/2014 Document Reviewed: 04/06/2012 [facility] Patient Information 2015 [place], [place]. This information is not intended to replace advice given to you by your health care provider. Make sure you discuss any questions you have with your health care provider.

## 2014-12-23 NOTE — Progress Notes (Signed)
   Subjective:    Patient ID: Bruce Carlson, male    DOB: Dec 26, 1985, 29 y.o.   MRN: [id number]  Chief Complaint  Patient presents with  . Rash    left side, painful x1 week   . Allergies    x2 mths    There are no active problems to display for this patient.  Medications, allergies, past medical history, surgical history, family history, social history and problem list reviewed and updated.  HPI  28 yom with pmh allergic rhinitis presents with rash.   First noticed rash on left trunk approx 2 wks ago. Was itchy at first. Has been intermittent across left flank during this time. Has been burning and painful past 4-5 days. Unsure if he had chicken pox as child but knows his sister did.   Also has chronic eczema left base of ear, jaw. Sometimes applies lotion but usually doesn't do much for it.   Has been having sneezing, rhinorrhea past few wks. Taking allegra daily.   Review of Systems No fevers, chills.     Objective:   Physical Exam  Constitutional: He is oriented to person, place, and time. He appears well-developed and well-nourished.  Non-toxic appearance. He does not have a sickly appearance. He does not appear ill. No distress.  BP 110/70 mmHg  Pulse 65  Temp(Src) 97.6 F (36.4 C) (Oral)  Resp 18  Ht 5' 6.5" (1.689 m)  Wt 157 lb (71.215 kg)  BMI 24.96 kg/m2  SpO2 99%   Neurological: He is alert and oriented to person, place, and time.  Skin:     Scattered papules across circled area over left trunk. 2-3 vesicles at posterior part of trunk with mild underlying erythema. TTP over middle of dermatome though no rash currently at that location.   Pruritic patch dry, patchy skin under left earlobe. Mild erythema and lichenification at site.       Assessment & Plan:   28 yom with pmh allergic rhinitis presents with rash.   Shingles rash - Plan: valACYclovir (VALTREX) 1000 MG tablet --rash with burning and ttp over dermatome likely shingles --valacyclovir tid 10  days --rtc with no relief  Eczema --hydrocortisone 2.5% bid 2-3 wks --Eucerin tid   Allergic rhinitis, unspecified allergic rhinitis type - Plan: fluticasone (FLONASE) 50 MCG/ACT nasal spray  Donnajean Lopesodd M. Aniqua Briere, PA-C Physician Assistant-Certified [facility]  12/23/2014 3:45 PM    Topical cs, eucerin Floase, allegra vala

## 2014-12-24 ENCOUNTER — Telehealth: Payer: Self-pay

## 2014-12-24 DIAGNOSIS — J309 Allergic rhinitis, unspecified: Secondary | ICD-10-CM

## 2014-12-24 DIAGNOSIS — B029 Zoster without complications: Secondary | ICD-10-CM

## 2014-12-24 MED ORDER — VALACYCLOVIR HCL 1 G PO TABS: 1000.0000 mg | ORAL_TABLET | Freq: Three times a day (TID) | ORAL | Status: DC

## 2014-12-24 MED ORDER — FLUTICASONE PROPIONATE 50 MCG/ACT NA SUSP: 2.0000 | Freq: Every day | NASAL | Status: DC

## 2014-12-24 MED ORDER — HYDROCORTISONE 2.5 % EX OINT: TOPICAL_OINTMENT | Freq: Two times a day (BID) | CUTANEOUS | Status: DC

## 2014-12-24 NOTE — Telephone Encounter (Signed)
Pt states that she was in yesterday and [facility], however they do not accept her ins there, so she would like to have the rx sent to [facility] [address] instead.  Best# [phone]

## 2014-12-24 NOTE — Telephone Encounter (Signed)
Spoke to pt's sister who had called and advised I will resend these to [facility] (she tried to have them transferred couldn't get it done).

## 2017-10-04 ENCOUNTER — Other Ambulatory Visit: Payer: Self-pay

## 2017-10-04 ENCOUNTER — Encounter (HOSPITAL_COMMUNITY): Payer: Self-pay

## 2017-10-04 ENCOUNTER — Emergency Department (HOSPITAL_COMMUNITY)
Admission: EM | Admit: 2017-10-04 | Discharge: 2017-10-04 | Disposition: A | Discharge status: Home / Self Care | Payer: No Typology Code available for payment source | Attending: Emergency Medicine | Admitting: Emergency Medicine

## 2017-10-04 ENCOUNTER — Emergency Department (HOSPITAL_COMMUNITY): Payer: No Typology Code available for payment source

## 2017-10-04 DIAGNOSIS — Y929 Unspecified place or not applicable: Secondary | ICD-10-CM | POA: Diagnosis not present

## 2017-10-04 DIAGNOSIS — Y998 Other external cause status: Secondary | ICD-10-CM | POA: Insufficient documentation

## 2017-10-04 DIAGNOSIS — Y939 Activity, unspecified: Secondary | ICD-10-CM | POA: Insufficient documentation

## 2017-10-04 DIAGNOSIS — M25551 Pain in right hip: Secondary | ICD-10-CM | POA: Diagnosis not present

## 2017-10-04 MED ORDER — METHOCARBAMOL 750 MG PO TABS: 750.0000 mg | ORAL_TABLET | Freq: Four times a day (QID) | ORAL | 0 refills | Status: DC

## 2017-10-04 MED ORDER — IBUPROFEN 600 MG PO TABS: 600.0000 mg | ORAL_TABLET | Freq: Four times a day (QID) | ORAL | 0 refills | Status: DC | PRN
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
## 2017-11-05 ENCOUNTER — Emergency Department (HOSPITAL_COMMUNITY): Payer: Self-pay

## 2017-11-05 ENCOUNTER — Emergency Department (HOSPITAL_COMMUNITY)
Admission: EM | Admit: 2017-11-05 | Discharge: 2017-11-05 | Disposition: A | Discharge status: Home / Self Care | Payer: Self-pay | Attending: Emergency Medicine | Admitting: Emergency Medicine

## 2017-11-05 ENCOUNTER — Encounter (HOSPITAL_COMMUNITY): Payer: Self-pay | Admitting: Emergency Medicine

## 2017-11-05 DIAGNOSIS — R109 Unspecified abdominal pain: Secondary | ICD-10-CM | POA: Insufficient documentation

## 2017-11-05 DIAGNOSIS — H9203 Otalgia, bilateral: Secondary | ICD-10-CM | POA: Insufficient documentation

## 2017-11-05 DIAGNOSIS — R197 Diarrhea, unspecified: Secondary | ICD-10-CM | POA: Insufficient documentation

## 2017-11-05 DIAGNOSIS — Z79899 Other long term (current) drug therapy: Secondary | ICD-10-CM | POA: Insufficient documentation

## 2017-11-05 DIAGNOSIS — R05 Cough: Secondary | ICD-10-CM | POA: Insufficient documentation

## 2017-11-05 DIAGNOSIS — R0981 Nasal congestion: Secondary | ICD-10-CM | POA: Insufficient documentation

## 2017-11-05 DIAGNOSIS — J020 Streptococcal pharyngitis: Secondary | ICD-10-CM | POA: Insufficient documentation

## 2017-11-05 LAB — CBC WITH DIFFERENTIAL/PLATELET
Basophils Absolute: 0 10*3/uL (ref 0.0–0.1)
Basophils Relative: 0 %
EOS PCT: 4 %
Eosinophils Absolute: 0.2 10*3/uL (ref 0.0–0.7)
HCT: 42.6 % (ref 39.0–52.0)
Hemoglobin: 14.3 g/dL (ref 13.0–17.0)
LYMPHS ABS: 1.4 10*3/uL (ref 0.7–4.0)
Lymphocytes Relative: 23 %
MCH: 28.4 pg (ref 26.0–34.0)
MCHC: 33.6 g/dL (ref 30.0–36.0)
MCV: 84.5 fL (ref 78.0–100.0)
Monocytes Absolute: 0.4 10*3/uL (ref 0.1–1.0)
Monocytes Relative: 7 %
NEUTROS PCT: 66 %
Neutro Abs: 4 10*3/uL (ref 1.7–7.7)
PLATELETS: 190 10*3/uL (ref 150–400)
RBC: 5.04 MIL/uL (ref 4.22–5.81)
RDW: 12.9 % (ref 11.5–15.5)
WBC: 6 10*3/uL (ref 4.0–10.5)

## 2017-11-05 LAB — RAPID STREP SCREEN (MED CTR MEBANE ONLY): Streptococcus, Group A Screen (Direct): POSITIVE — AB

## 2017-11-05 LAB — I-STAT CHEM 8, ED
BUN: 10 mg/dL (ref 6–20)
Calcium, Ion: 1.1 mmol/L — ABNORMAL LOW (ref 1.15–1.40)
Chloride: 100 mmol/L — ABNORMAL LOW (ref 101–111)
Creatinine, Ser: 1 mg/dL (ref 0.61–1.24)
Glucose, Bld: 160 mg/dL — ABNORMAL HIGH (ref 65–99)
HEMATOCRIT: 44 % (ref 39.0–52.0)
Hemoglobin: 15 g/dL (ref 13.0–17.0)
Potassium: 3.6 mmol/L (ref 3.5–5.1)
Sodium: 140 mmol/L (ref 135–145)
TCO2: 28 mmol/L (ref 22–32)

## 2017-11-05 LAB — INFLUENZA PANEL BY PCR (TYPE A & B)
INFLAPCR: NEGATIVE
INFLBPCR: NEGATIVE

## 2017-11-05 MED ORDER — MORPHINE SULFATE (PF) 4 MG/ML IV SOLN
4.0000 mg | Freq: Once | INTRAVENOUS | Status: AC
  Administered 2017-11-05: 4 mg via INTRAVENOUS
  Filled 2017-11-05: qty 1

## 2017-11-05 MED ORDER — ONDANSETRON HCL 4 MG/2ML IJ SOLN
4.0000 mg | Freq: Once | INTRAMUSCULAR | Status: AC
  Administered 2017-11-05: 4 mg via INTRAVENOUS
  Filled 2017-11-05: qty 2

## 2017-11-05 MED ORDER — HYDROCODONE-ACETAMINOPHEN 7.5-325 MG/15ML PO SOLN: 10.0000 mL | Freq: Four times a day (QID) | ORAL | 0 refills | Status: DC | PRN

## 2017-11-05 MED ORDER — CHLORHEXIDINE GLUCONATE 0.12 % MT SOLN: 15.0000 mL | Freq: Two times a day (BID) | OROMUCOSAL | 0 refills | Status: DC

## 2017-11-05 MED ORDER — PENICILLIN G BENZATHINE 1200000 UNIT/2ML IM SUSP
1.2000 10*6.[IU] | Freq: Once | INTRAMUSCULAR | Status: AC
Start: 2017-11-05 — End: 2017-11-05
  Administered 2017-11-05: 1.2 10*6.[IU] via INTRAMUSCULAR
  Filled 2017-11-05: qty 2

## 2017-11-05 MED ORDER — SODIUM CHLORIDE 0.9 % IV BOLUS (SEPSIS)
1000.0000 mL | Freq: Once | INTRAVENOUS | Status: AC
  Administered 2017-11-05: 1000 mL via INTRAVENOUS

## 2017-11-05 NOTE — Discharge Instructions (Signed)
Rinse mouth twice daily with peridex.  Take Hycet as needed for pain.  Rest, drink plenty of fluid.  Return if your condition worsen or if you have other concerns.

## 2017-11-05 NOTE — ED Provider Notes (Signed)
[facility] EMERGENCY DEPARTMENT Provider Note   CSN: [id number] Arrival date & time: 11/05/17  4782     History   Chief Complaint Chief Complaint  Patient presents with  . Allergic Reaction    HPI Bruce Carlson is a 32 y.o. male.  HPI   32 year old male presenting to the ER via EMS from home for evaluation of viral symptoms.  History obtained through wife who is at bedside.  Initially EMS received a call of a patient complaining of throat discomfort after eating breakfast.  They initially thought patient may have an allergic reaction after eating breakfast and patient was given epinephrine, Benadryl, and albuterol.  However, wife mentioned that she thinks he is sick with the same sickness that his family is having right now.  Wife mentioned for the past week both of the twins are having vomiting and diarrhea.  The wife also has viral symptoms including sinus congestion, vomiting and diarrhea.  Patient also is having the same symptoms.  Patient endorses having chills, headache, sore throat, ear pain, abdominal pain, occasional cough.  This morning, his throat was very uncomfortable and EMS was contacted.  It was documented that patient is allergic to shrimp, however wife mentioned that he eats shrimps all the time without allergic reaction, and does not have any known allergies.  No complaints of shortness of breath focal numbness or weakness or rash.  Past Medical History:  Diagnosis Date  . Allergy   . Depression     There are no active problems to display for this patient.   No past surgical history on file.     Home Medications    Prior to Admission medications   Medication Sig Start Date End Date Taking? Authorizing Provider  fluticasone (FLONASE) 50 MCG/ACT nasal spray Place 2 sprays into both nostrils daily. 12/24/14   McVeigh, Todd, PA  hydrocortisone 2.5 % ointment Apply topically 2 (two) times daily. 12/24/14   McVeigh, Tawanna Cooler, PA  ibuprofen (ADVIL,MOTRIN)  600 MG tablet Take 1 tablet (600 mg total) by mouth every 6 (six) hours as needed. 10/04/17   Lorre Nick, MD  methocarbamol (ROBAXIN-750) 750 MG tablet Take 1 tablet (750 mg total) by mouth 4 (four) times daily. 10/04/17   Lorre Nick, MD  valACYclovir (VALTREX) 1000 MG tablet Take 1 tablet (1,000 mg total) by mouth 3 (three) times daily. 12/24/14   Raelyn Ensign, PA    Family History Family History  Problem Relation Age of Onset  . Hypertension Mother   . Hyperlipidemia Mother   . Hypertension Father   . Stroke Father   . Hyperlipidemia Father     Social History Social History   Tobacco Use  . Smoking status: Never Smoker  . Smokeless tobacco: Never Used  Substance Use Topics  . Alcohol use: No  . Drug use: No     Allergies   Patient has no known allergies.   Review of Systems Review of Systems  All other systems reviewed and are negative.    Physical Exam Updated Vital Signs BP 118/75 (BP Location: Right Arm)   Pulse 81   Temp 98.3 F (36.8 C) (Oral)   Resp 20   SpO2 98%   Physical Exam  Constitutional: He is oriented to person, place, and time. He appears well-developed and well-nourished. No distress.  Patient appears uncomfortable but nontoxic  HENT:  Head: Atraumatic.  Ears: Ear canals are mildly erythematous but TMs normal bilaterally Nose: Rhinorrhea Throat: Uvula midline no tonsillar  enlargement or exudates, mild post oropharyngeal erythema, no trismus  Eyes: Conjunctivae are normal.  Neck: Normal range of motion. Neck supple.  No nuchal rigidity  Cardiovascular:  Mild tachycardia without murmur rubs or gallops  Pulmonary/Chest: Effort normal and breath sounds normal.  No wheezes, rales, rhonchi  Abdominal: Soft. Bowel sounds are normal. He exhibits no distension. There is no tenderness.  Musculoskeletal:  Able to move all 4 extremities  Lymphadenopathy:    He has no cervical adenopathy.  Neurological: He is alert and oriented to person,  place, and time.  Skin: No rash noted.  Psychiatric: He has a normal mood and affect.  Nursing note and vitals reviewed.    ED Treatments / Results  Labs (all labs ordered are listed, but only abnormal results are displayed) Labs Reviewed  RAPID STREP SCREEN (NOT AT [facility]) - Abnormal; Notable for the following components:      Result Value   Streptococcus, Group A Screen (Direct) POSITIVE (*)    All other components within normal limits  I-STAT CHEM 8, ED - Abnormal; Notable for the following components:   Chloride 100 (*)    Glucose, Bld 160 (*)    Calcium, Ion 1.10 (*)    All other components within normal limits  CBC WITH DIFFERENTIAL/PLATELET  INFLUENZA PANEL BY PCR (TYPE A & B)    EKG  EKG Interpretation None       Radiology Dg Chest 2 View  Result Date: 11/05/2017 CLINICAL DATA:  Cough EXAM: CHEST  2 VIEW COMPARISON:  11/23/2009 FINDINGS: The heart size and mediastinal contours are within normal limits. Both lungs are clear. The visualized skeletal structures are unremarkable. IMPRESSION: No active cardiopulmonary disease. Electronically Signed   By: Elige Ko   On: 11/05/2017 08:14    Procedures Procedures (including critical care time)  Medications Ordered in ED Medications  sodium chloride 0.9 % bolus 1,000 mL (0 mLs Intravenous Stopped 11/05/17 0935)  ondansetron (ZOFRAN) injection 4 mg (4 mg Intravenous Given 11/05/17 0750)  morphine 4 MG/ML injection 4 mg (4 mg Intravenous Given 11/05/17 0750)  penicillin g benzathine (BICILLIN LA) 1200000 UNIT/2ML injection 1.2 Million Units (1.2 Million Units Intramuscular Given 11/05/17 0935)     Initial Impression / Assessment and Plan / ED Course  I have reviewed the triage vital signs and the nursing notes.  Pertinent labs & imaging results that were available during my care of the patient were reviewed by me and considered in my medical decision making (see chart for details).     BP 118/75 (BP Location: Right Arm)    Pulse 81   Temp 98.3 F (36.8 C) (Oral)   Resp 20   SpO2 98%    Final Clinical Impressions(s) / ED Diagnoses   Final diagnoses:  Strep pharyngitis    ED Discharge Orders        Ordered    HYDROcodone-acetaminophen (HYCET) 7.5-325 mg/15 ml solution  Every 6 hours PRN     11/05/17 1008    chlorhexidine (PERIDEX) 0.12 % solution  2 times daily     11/05/17 1008     7:45 AM Patient initially brought here via EMS with concerns for allergic reaction after eating breakfast.  However upon further questioning, patient has had viral symptoms for the past several days, family members with same symptoms.  He does not have any evidence of allergic reaction, no urticarial rash, no wheezing, no throat swelling and no airway compromise.  Will perform screening exam, and provide  supportive measures.  9:30 AM Strep test positive.  Patient will receive Bicillin 1,200,000 units IM.  The remainder of his labs are reassuring.  10:07 AM Pt felt better, stable for discharge.  Will provide sxs treatment.  Return precaution given.  In order to decrease risk of narcotic abuse. Pt's record were checked using the  Controlled Substance database.     Fayrene Helperran, Shalamar Crays, PA-C 11/05/17 1009    Benjiman CorePickering, Nathan, MD 11/05/17 [phone]

## 2017-11-05 NOTE — ED Triage Notes (Signed)
Pt here from home with c/o allergic reaction while eating breakfast , no history of same . Pt received epi,benadryl and albuterol from ems , nad at present

## 2017-11-05 NOTE — ED Notes (Signed)
Patient transported to X-ray 

## 2018-12-24 ENCOUNTER — Encounter (HOSPITAL_COMMUNITY): Payer: Self-pay | Admitting: Emergency Medicine

## 2018-12-24 ENCOUNTER — Other Ambulatory Visit: Payer: Self-pay

## 2018-12-24 ENCOUNTER — Emergency Department (HOSPITAL_COMMUNITY)
Admission: EM | Admit: 2018-12-24 | Discharge: 2018-12-24 | Disposition: A | Discharge status: Home / Self Care | Payer: Self-pay | Attending: Emergency Medicine | Admitting: Emergency Medicine

## 2018-12-24 ENCOUNTER — Emergency Department (HOSPITAL_COMMUNITY): Payer: Self-pay

## 2018-12-24 DIAGNOSIS — Z79899 Other long term (current) drug therapy: Secondary | ICD-10-CM | POA: Insufficient documentation

## 2018-12-24 DIAGNOSIS — M5441 Lumbago with sciatica, right side: Secondary | ICD-10-CM | POA: Insufficient documentation

## 2018-12-24 LAB — URINALYSIS, ROUTINE W REFLEX MICROSCOPIC
Bilirubin Urine: NEGATIVE
Glucose, UA: NEGATIVE mg/dL
Hgb urine dipstick: NEGATIVE
Ketones, ur: NEGATIVE mg/dL
Leukocytes,Ua: NEGATIVE
Nitrite: NEGATIVE
Protein, ur: NEGATIVE mg/dL
Specific Gravity, Urine: 1.014 (ref 1.005–1.030)
pH: 6 (ref 5.0–8.0)

## 2018-12-24 MED ORDER — HYDROCODONE-ACETAMINOPHEN 5-325 MG PO TABS
1.0000 | ORAL_TABLET | Freq: Once | ORAL | Status: AC
  Administered 2018-12-24: 10:00:00 1 via ORAL
  Filled 2018-12-24: qty 1

## 2018-12-24 MED ORDER — NAPROXEN 500 MG PO TABS: 500.0000 mg | ORAL_TABLET | Freq: Two times a day (BID) | ORAL | 0 refills | Status: DC

## 2018-12-24 MED ORDER — METHOCARBAMOL 500 MG PO TABS: 500.0000 mg | ORAL_TABLET | Freq: Two times a day (BID) | ORAL | 0 refills | Status: DC

## 2018-12-24 MED ORDER — LIDOCAINE 5 % EX PTCH
1.0000 | MEDICATED_PATCH | CUTANEOUS | Status: DC
  Administered 2018-12-24: 10:00:00 1 via TRANSDERMAL
  Filled 2018-12-24: qty 1

## 2018-12-24 MED ORDER — KETOROLAC TROMETHAMINE 15 MG/ML IJ SOLN
30.0000 mg | Freq: Once | INTRAMUSCULAR | Status: AC
  Administered 2018-12-24: 10:00:00 30 mg via INTRAMUSCULAR
  Filled 2018-12-24: qty 2

## 2018-12-24 NOTE — Discharge Instructions (Signed)
Take Naproxen twice a day for pain Take Robaxin for muscle spasms - this medicine can make you sleepy so do not take before driving or work Continue to use Icy-hot patches Please follow up with a orthopedic doctor
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
## 2018-12-24 NOTE — ED Notes (Signed)
Pt verbalized understanding of all d/c instructions and prescriptions and f/u information. Opportunity for questioning and answers provided. VSS. All belongings with pt at this time.

## 2019-04-24 ENCOUNTER — Emergency Department (HOSPITAL_COMMUNITY): Payer: Self-pay

## 2019-04-24 ENCOUNTER — Emergency Department (HOSPITAL_COMMUNITY)
Admission: EM | Admit: 2019-04-24 | Discharge: 2019-04-25 | Disposition: A | Discharge status: Home / Self Care | Payer: Self-pay | Attending: Emergency Medicine | Admitting: Emergency Medicine

## 2019-04-24 ENCOUNTER — Other Ambulatory Visit: Payer: Self-pay

## 2019-04-24 DIAGNOSIS — Y939 Activity, unspecified: Secondary | ICD-10-CM | POA: Insufficient documentation

## 2019-04-24 DIAGNOSIS — X58XXXA Exposure to other specified factors, initial encounter: Secondary | ICD-10-CM | POA: Insufficient documentation

## 2019-04-24 DIAGNOSIS — Z20828 Contact with and (suspected) exposure to other viral communicable diseases: Secondary | ICD-10-CM | POA: Insufficient documentation

## 2019-04-24 DIAGNOSIS — Y999 Unspecified external cause status: Secondary | ICD-10-CM | POA: Insufficient documentation

## 2019-04-24 DIAGNOSIS — T18108A Unspecified foreign body in esophagus causing other injury, initial encounter: Secondary | ICD-10-CM | POA: Insufficient documentation

## 2019-04-24 DIAGNOSIS — Y929 Unspecified place or not applicable: Secondary | ICD-10-CM | POA: Insufficient documentation

## 2019-04-24 MED ORDER — GLUCAGON HCL RDNA (DIAGNOSTIC) 1 MG IJ SOLR
1.0000 mg | Freq: Once | INTRAMUSCULAR | Status: AC
  Administered 2019-04-24: 22:00:00 1 mg via INTRAVENOUS
  Filled 2019-04-24: qty 1

## 2019-04-24 MED ORDER — LIDOCAINE VISCOUS HCL 2 % MT SOLN
15.0000 mL | Freq: Once | OROMUCOSAL | Status: AC
  Administered 2019-04-24: 15 mL via ORAL
  Filled 2019-04-24: qty 15

## 2019-04-24 MED ORDER — ALUM & MAG HYDROXIDE-SIMETH 200-200-20 MG/5ML PO SUSP
30.0000 mL | Freq: Once | ORAL | Status: DC
  Filled 2019-04-24: qty 30

## 2019-04-24 MED ORDER — FAMOTIDINE IN NACL 20-0.9 MG/50ML-% IV SOLN
20.0000 mg | Freq: Once | INTRAVENOUS | Status: AC
  Administered 2019-04-24: 23:00:00 20 mg via INTRAVENOUS
  Filled 2019-04-24: qty 50

## 2019-04-24 MED ORDER — FENTANYL CITRATE (PF) 100 MCG/2ML IJ SOLN
50.0000 ug | Freq: Once | INTRAMUSCULAR | Status: AC
  Administered 2019-04-24: 50 ug via INTRAVENOUS
  Filled 2019-04-24: qty 2

## 2019-04-24 NOTE — ED Provider Notes (Signed)
[facility] DEPT Provider Note   CSN: [id number] Arrival date & time: 04/24/19  2032    History   Chief Complaint Chief Complaint  Patient presents with   Choking   Airway Obstruction   Swallowed Foreign Body    HPI Bruce Carlson is a 33 y.o. male presents today for evaluation of acute onset, persistent swallow foreign body sensation.  He reports that he was eating pork at around 8 PM when he felt him self swallow the bone.  He states he feels as though the bone is lodged in his throat.  He notes a sharp pain with swallowing which radiates up to the ears bilaterally.  He called EMS who noted bloody salivary secretions.  He states he has a hard time swallowing due to pain.  He is spitting secretions into a bag.  Denies difficulty breathing, chest pains, fevers, abdominal pain, nausea, or vomiting.     The history is provided by the patient.  Swallowed Foreign Body Pertinent negatives include no chest pain, no abdominal pain and no shortness of breath.    Past Medical History:  Diagnosis Date   Allergy    Depression     There are no active problems to display for this patient.   No past surgical history on file.      Home Medications    Prior to Admission medications   Medication Sig Start Date End Date Taking? Authorizing Provider  chlorhexidine (PERIDEX) 0.12 % solution Use as directed 15 mLs in the mouth or throat 2 (two) times daily. Patient not taking: Reported on 04/24/2019 11/05/17   Domenic Moras, PA-C  fluticasone [facility]) 50 MCG/ACT nasal spray Place 2 sprays into both nostrils daily. Patient not taking: Reported on 04/24/2019 12/24/14   Araceli Bouche, PA  HYDROcodone-acetaminophen (HYCET) 7.5-325 mg/15 ml solution Take 10 mLs by mouth every 6 (six) hours as needed for moderate pain. Patient not taking: Reported on 04/24/2019 11/05/17   Domenic Moras, PA-C  hydrocortisone 2.5 % ointment Apply topically 2 (two) times daily. Patient not  taking: Reported on 04/24/2019 12/24/14   Araceli Bouche, PA  ibuprofen (ADVIL,MOTRIN) 600 MG tablet Take 1 tablet (600 mg total) by mouth every 6 (six) hours as needed. Patient not taking: Reported on 04/24/2019 10/04/17   Lacretia Leigh, MD  methocarbamol (ROBAXIN) 500 MG tablet Take 1 tablet (500 mg total) by mouth 2 (two) times daily. Patient not taking: Reported on 04/24/2019 12/24/18   Recardo Evangelist, PA-C  naproxen (NAPROSYN) 500 MG tablet Take 1 tablet (500 mg total) by mouth 2 (two) times daily. Patient not taking: Reported on 04/24/2019 12/24/18   Recardo Evangelist, PA-C  valACYclovir (VALTREX) 1000 MG tablet Take 1 tablet (1,000 mg total) by mouth 3 (three) times daily. Patient not taking: Reported on 04/24/2019 12/24/14   Araceli Bouche, PA    Family History Family History  Problem Relation Age of Onset   Hypertension Mother    Hyperlipidemia Mother    Hypertension Father    Stroke Father    Hyperlipidemia Father     Social History Social History   Tobacco Use   Smoking status: Never Smoker   Smokeless tobacco: Never Used  Substance Use Topics   Alcohol use: No   Drug use: No     Allergies   Shellfish allergy   Review of Systems Review of Systems  Constitutional: Negative for chills and fever.  HENT: Positive for trouble swallowing. Negative for voice change.   Respiratory:  Negative for cough and shortness of breath.   Cardiovascular: Negative for chest pain.  Gastrointestinal: Negative for abdominal pain, nausea and vomiting.  All other systems reviewed and are negative.    Physical Exam Updated Vital Signs BP 133/86 (BP Location: Right Arm)    Pulse 84    Temp 98.2 F (36.8 C) (Oral)    Resp 15    SpO2 99%   Physical Exam Vitals signs and nursing note reviewed.  Constitutional:      General: He is not in acute distress.    Appearance: He is well-developed.  HENT:     Head: Normocephalic and atraumatic.     Mouth/Throat:     Mouth: Mucous  membranes are moist.     Comments: Posterior oropharynx with mild excoriations, no active bleeding.  Tolerating secretions on my assessment, no abnormal phonation.  Eyes:     General:        Right eye: No discharge.        Left eye: No discharge.     Conjunctiva/sclera: Conjunctivae normal.  Neck:     Musculoskeletal: Normal range of motion and neck supple.     Vascular: No JVD.     Trachea: No tracheal deviation.     Comments: Tenderness to palpation of the lateral aspect of the trachea bilaterally, no submental tenderness.  Cardiovascular:     Rate and Rhythm: Normal rate.  Pulmonary:     Effort: Pulmonary effort is normal.  Abdominal:     General: Bowel sounds are normal. There is no distension.     Palpations: Abdomen is soft.     Tenderness: There is no abdominal tenderness. There is no guarding or rebound.  Skin:    General: Skin is warm and dry.     Findings: No erythema.  Neurological:     Mental Status: He is alert.  Psychiatric:        Behavior: Behavior normal.      ED Treatments / Results  Labs (all labs ordered are listed, but only abnormal results are displayed) Labs Reviewed  SARS CORONAVIRUS 2 (HOSPITAL ORDER, [facility]FOR[facility])    EKG None  Radiology Dg Neck Soft Tissue  Result Date: 04/24/2019 CLINICAL DATA:  Swallowed foreign body, pork rib bone EXAM: NECK SOFT TISSUES - 1+ VIEW COMPARISON:  None. FINDINGS: There is a radiopaque foreign body within the soft tissues at the level of the cervical esophagus. Additional linear radiodensities seen just superior may reflect a small additional foreign body. No focal retropharyngeal soft tissue swelling or soft tissue gas. Cervical airway is unremarkable. Osseous structures are normally aligned with minimal cervical spondylitic changes. IMPRESSION: Mineral density foreign bodies region of the cervical esophagus compatible with reported history of rib bone ingestion. Electronically Signed   By:  Kreg ShropshirePrice  DeHay M.D.   On: 04/24/2019 23:31   Dg Chest 2 View  Result Date: 04/24/2019 CLINICAL DATA:  Ingested foreign body, pork rib bone EXAM: CHEST - 2 VIEW COMPARISON:  Radiograph 11/23/2009 FINDINGS: No consolidation, features of edema, pneumothorax, or effusion. Pulmonary vascularity is normally distributed. The cardiomediastinal contours are unremarkable. No acute osseous or soft tissue abnormality. IMPRESSION: No acute cardiopulmonary abnormality or visible foreign body. Electronically Signed   By: Kreg ShropshirePrice  DeHay M.D.   On: 04/24/2019 23:32    Procedures Procedures (including critical care time)  Medications Ordered in ED Medications  alum & mag hydroxide-simeth (MAALOX/MYLANTA) 200-200-20 MG/5ML suspension 30 mL (30 mLs Oral Not Given 04/24/19  2350)    And  lidocaine (XYLOCAINE) 2 % viscous mouth solution 15 mL (15 mLs Oral Given 04/24/19 2311)  fentaNYL (SUBLIMAZE) injection 50 mcg (50 mcg Intravenous Given 04/24/19 2107)  glucagon (human recombinant) (GLUCAGEN) injection 1 mg (1 mg Intravenous Given 04/24/19 2218)  famotidine (PEPCID) IVPB 20 mg premix (0 mg Intravenous Stopped 04/24/19 2350)     Initial Impression / Assessment and Plan / ED Course  I have reviewed the triage vital signs and the nursing notes.  Pertinent labs & imaging results that were available during my care of the patient were reviewed by me and considered in my medical decision making (see chart for details).        Patient presenting for evaluation of a swallowed foreign body.  He is afebrile, vital signs are stable.  He is uncomfortable but nontoxic in appearance.  I am unable to visualize the bone that he swallowed but he feels that it is stuck proximally in the esophagus.  He has no upper airway stridor, is phonating normally when I speak with him and is tolerating secretions on my assessment after administration of glucagon and fentanyl though he had no symptomatic relief with these medications.  Radiographs  obtained show a mineral density foreign body in the region of the cervical esophagus compatible with his reported history of pork bone ingestion.  11:38PM CONSULT: Spoke with Dr. Elnoria HowardHung with gastroenterology who reports that as the patient is tolerating secretions he would be stable for admission to the hospital and plan for endoscopy in the morning.  We will give the patient a GI cocktail and IV Pepcid for comfort.  11:50PM RN Rinaldo RatelGarrison informed me that patient was able to expectorate the foreign body (see below).  He tells me he is feeling much better.  He is tolerating p.o. fluids without difficulty.  He is no longer spitting up bloody secretions.  He continues to phonate normally and there appears to be no evidence of airway obstruction or compromise.  He would like to go home.  I spoke with Dr. Elnoria HowardHung on the phone who is agreeable to discharging the patient as has acute problem is now resolved.  Discussed pain management with NSAIDs and Tylenol with the patient, recommend soft diet for the next few days.  Will recommend follow-up with gastroenterologist if symptoms persist.  Discussed strict ED return precautions.  Patient verbalized understanding of and agreement with plan and patient stable for discharge home at this time.      Final Clinical Impressions(s) / ED Diagnoses   Final diagnoses:  Foreign body in esophagus, initial encounter    ED Discharge Orders    None       Jeanie SewerFawze, Nahomi Hegner A, PA-C 04/25/19 0041    Wynetta FinesMessick, Peter C, MD 04/25/19 1445
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
## 2019-04-25 LAB — SARS CORONAVIRUS 2 BY RT PCR (HOSPITAL ORDER, PERFORMED IN ~~LOC~~ HOSPITAL LAB): SARS Coronavirus 2: NEGATIVE

## 2019-04-25 NOTE — Discharge Instructions (Signed)
You can take 1 to 2 tablets of Tylenol (350mg -1000mg  depending on the dose) every 6 hours as needed for pain.  Do not exceed 4000 mg of Tylenol daily.  If your pain persists you can take a doses of ibuprofen in between doses of Tylenol.  I usually recommend 400 to 600 mg of ibuprofen every 6 hours.  Take this with food to avoid upset stomach issues.  For the next few days, eat a diet of soft foods. Not too hot, not too cold.  Drink plenty of fluids and get plenty of rest.  Follow-up with the gastroenterologist as needed if your symptoms persist.  Return to the emergency department if any concerning signs or symptoms develop such as high fevers, severe pain, vomiting blood, or loss of consciousness.

## 2019-04-25 NOTE — ED Notes (Signed)
Upon entering room to draw blood, patient showed RN a 3 cm long pork bone that he coughed up. Patient tolerated PO fluid without nausea or vomiting. Patient requesting to go home.
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
## 2019-10-29 ENCOUNTER — Ambulatory Visit: Payer: No Typology Code available for payment source | Attending: Internal Medicine

## 2019-10-29 DIAGNOSIS — Z20822 Contact with and (suspected) exposure to covid-19: Secondary | ICD-10-CM

## 2019-10-30 LAB — NOVEL CORONAVIRUS, NAA: SARS-CoV-2, NAA: NOT DETECTED

## 2020-10-21 ENCOUNTER — Emergency Department (HOSPITAL_COMMUNITY): Payer: Self-pay

## 2020-10-21 ENCOUNTER — Encounter (HOSPITAL_COMMUNITY): Payer: Self-pay | Admitting: Emergency Medicine

## 2020-10-21 ENCOUNTER — Other Ambulatory Visit: Payer: Self-pay

## 2020-10-21 ENCOUNTER — Emergency Department (HOSPITAL_COMMUNITY)
Admission: EM | Admit: 2020-10-21 | Discharge: 2020-10-22 | Disposition: A | Discharge status: Home / Self Care | Payer: Self-pay | Attending: Emergency Medicine | Admitting: Emergency Medicine

## 2020-10-21 DIAGNOSIS — Z20822 Contact with and (suspected) exposure to covid-19: Secondary | ICD-10-CM | POA: Insufficient documentation

## 2020-10-21 DIAGNOSIS — F29 Unspecified psychosis not due to a substance or known physiological condition: Secondary | ICD-10-CM | POA: Insufficient documentation

## 2020-10-21 DIAGNOSIS — Z638 Other specified problems related to primary support group: Secondary | ICD-10-CM

## 2020-10-21 DIAGNOSIS — F23 Brief psychotic disorder: Secondary | ICD-10-CM

## 2020-10-21 DIAGNOSIS — F332 Major depressive disorder, recurrent severe without psychotic features: Secondary | ICD-10-CM | POA: Insufficient documentation

## 2020-10-21 DIAGNOSIS — F19959 Other psychoactive substance use, unspecified with psychoactive substance-induced psychotic disorder, unspecified: Secondary | ICD-10-CM | POA: Diagnosis present

## 2020-10-21 LAB — URINALYSIS, ROUTINE W REFLEX MICROSCOPIC
Bilirubin Urine: NEGATIVE
Glucose, UA: NEGATIVE mg/dL
Hgb urine dipstick: NEGATIVE
Ketones, ur: NEGATIVE mg/dL
Leukocytes,Ua: NEGATIVE
Nitrite: NEGATIVE
Protein, ur: NEGATIVE mg/dL
Specific Gravity, Urine: 1.006 (ref 1.005–1.030)
pH: 7 (ref 5.0–8.0)

## 2020-10-21 LAB — CBC WITH DIFFERENTIAL/PLATELET
Abs Immature Granulocytes: 0.01 10*3/uL (ref 0.00–0.07)
Basophils Absolute: 0 10*3/uL (ref 0.0–0.1)
Basophils Relative: 0 %
Eosinophils Absolute: 0.1 10*3/uL (ref 0.0–0.5)
Eosinophils Relative: 2 %
HCT: 42.6 % (ref 39.0–52.0)
Hemoglobin: 14.4 g/dL (ref 13.0–17.0)
Immature Granulocytes: 0 %
Lymphocytes Relative: 41 %
Lymphs Abs: 2 10*3/uL (ref 0.7–4.0)
MCH: 28.3 pg (ref 26.0–34.0)
MCHC: 33.8 g/dL (ref 30.0–36.0)
MCV: 83.9 fL (ref 80.0–100.0)
Monocytes Absolute: 0.5 10*3/uL (ref 0.1–1.0)
Monocytes Relative: 10 %
Neutro Abs: 2.2 10*3/uL (ref 1.7–7.7)
Neutrophils Relative %: 47 %
Platelets: 255 10*3/uL (ref 150–400)
RBC: 5.08 MIL/uL (ref 4.22–5.81)
RDW: 12.1 % (ref 11.5–15.5)
WBC: 4.8 10*3/uL (ref 4.0–10.5)
nRBC: 0 % (ref 0.0–0.2)

## 2020-10-21 LAB — COMPREHENSIVE METABOLIC PANEL
ALT: 24 U/L (ref 0–44)
AST: 20 U/L (ref 15–41)
Albumin: 4.6 g/dL (ref 3.5–5.0)
Alkaline Phosphatase: 60 U/L (ref 38–126)
Anion gap: 8 (ref 5–15)
BUN: 8 mg/dL (ref 6–20)
CO2: 29 mmol/L (ref 22–32)
Calcium: 9.3 mg/dL (ref 8.9–10.3)
Chloride: 101 mmol/L (ref 98–111)
Creatinine, Ser: 0.93 mg/dL (ref 0.61–1.24)
GFR, Estimated: >60 mL/min (ref >60)
Glucose, Bld: 103 mg/dL — ABNORMAL HIGH (ref 70–99)
Potassium: 3.7 mmol/L (ref 3.5–5.1)
Sodium: 138 mmol/L (ref 135–145)
Total Bilirubin: 0.7 mg/dL (ref 0.3–1.2)
Total Protein: 8.2 g/dL — ABNORMAL HIGH (ref 6.5–8.1)

## 2020-10-21 LAB — RESP PANEL BY RT-PCR (FLU A&B, COVID) ARPGX2
Influenza A by PCR: NEGATIVE
Influenza B by PCR: NEGATIVE
SARS Coronavirus 2 by RT PCR: NEGATIVE

## 2020-10-21 LAB — RAPID URINE DRUG SCREEN, HOSP PERFORMED
Amphetamines: POSITIVE — AB
Barbiturates: NOT DETECTED
Benzodiazepines: POSITIVE — AB
Cocaine: NOT DETECTED
Opiates: NOT DETECTED
Tetrahydrocannabinol: NOT DETECTED

## 2020-10-21 LAB — SALICYLATE LEVEL: Salicylate Lvl: <7 mg/dL — ABNORMAL LOW (ref 7.0–30.0)

## 2020-10-21 LAB — ETHANOL: Alcohol, Ethyl (B): <10 mg/dL (ref <10)

## 2020-10-21 LAB — CBG MONITORING, ED
Glucose-Capillary: 66 mg/dL — ABNORMAL LOW (ref 70–99)
Glucose-Capillary: 66 mg/dL — ABNORMAL LOW (ref 70–99)

## 2020-10-21 LAB — ACETAMINOPHEN LEVEL: Acetaminophen (Tylenol), Serum: <10 ug/mL — ABNORMAL LOW (ref 10–30)

## 2020-10-21 MED ORDER — LORAZEPAM 2 MG/ML IJ SOLN
2.0000 mg | Freq: Once | INTRAMUSCULAR | Status: AC
  Administered 2020-10-21: 2 mg via INTRAVENOUS
  Filled 2020-10-21: qty 1

## 2020-10-21 MED ORDER — SODIUM CHLORIDE 0.9 % IV BOLUS
1000.0000 mL | Freq: Once | INTRAVENOUS | Status: AC
  Administered 2020-10-21: 1000 mL via INTRAVENOUS

## 2020-10-21 NOTE — ED Notes (Signed)
Pt sitting on edge of bed. Pt adamant he is leaving. MD made aware
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
## 2020-10-21 NOTE — ED Notes (Signed)
Pt provided with several warm blankets.
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
## 2020-10-21 NOTE — ED Notes (Signed)
Pt given orange juice and sandwich. Will continue to monitor CBG at this time.

## 2020-10-21 NOTE — ED Notes (Signed)
Pt transported to CT at this time.

## 2020-10-21 NOTE — BH Assessment (Signed)
TTS attempted to assess patient. RN set up tele-psych in room and attempted to rouse patient, however he could not wake up to participate. TTS will assess when alert.
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
## 2020-10-21 NOTE — ED Notes (Signed)
History and Suicide screening were unable to be assessed at this time due to pt being sedated.
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
## 2020-10-21 NOTE — ED Provider Notes (Signed)
[facility]-EMERGENCY DEPT Provider Note   CSN: [id number] Arrival date & time: 10/21/20  1057  LEVEL 5 CAVEAT - ALTERED MENTAL STATUS  History Chief Complaint  Patient presents with  . Psychiatric Evaluation    Bruce Carlson is a 35 y.o. male.  HPI 35 year old male presents with altered mental status and agitation.  History is quite limited as EMS/police are no longer present and the patient is altered.  Per the nurse who took report, the patient was agitated and not acting like himself and so his girlfriend called 911.  The patient was only shouting "I didn't do it".  He had to be wrestled to the ground by police and was given IM Versed by EMS.  They had to physically remove a gun from his person.  The other circumstances and other history is extremely limited, level 5 caveat.   Past Medical History:  Diagnosis Date  . Allergy   . Depression     There are no problems to display for this patient.   History reviewed. No pertinent surgical history.     Family History  Problem Relation Age of Onset  . Hypertension Mother   . Hyperlipidemia Mother   . Hypertension Father   . Stroke Father   . Hyperlipidemia Father     Social History   Tobacco Use  . Smoking status: Never Smoker  . Smokeless tobacco: Never Used  Vaping Use  . Vaping Use: Never used  Substance Use Topics  . Alcohol use: No  . Drug use: No    Home Medications Prior to Admission medications   Medication Sig Start Date End Date Taking? Authorizing Provider  chlorhexidine (PERIDEX) 0.12 % solution Use as directed 15 mLs in the mouth or throat 2 (two) times daily. Patient not taking: Reported on 04/24/2019 11/05/17   Fayrene Helper, PA-C  fluticasone [facility]) 50 MCG/ACT nasal spray Place 2 sprays into both nostrils daily. Patient not taking: Reported on 04/24/2019 12/24/14   Raelyn Ensign, PA  HYDROcodone-acetaminophen (HYCET) 7.5-325 mg/15 ml solution Take 10 mLs by mouth every 6 (six)  hours as needed for moderate pain. Patient not taking: Reported on 04/24/2019 11/05/17   Fayrene Helper, PA-C  hydrocortisone 2.5 % ointment Apply topically 2 (two) times daily. Patient not taking: Reported on 04/24/2019 12/24/14   Raelyn Ensign, PA  ibuprofen (ADVIL,MOTRIN) 600 MG tablet Take 1 tablet (600 mg total) by mouth every 6 (six) hours as needed. Patient not taking: Reported on 04/24/2019 10/04/17   Lorre Nick, MD  methocarbamol (ROBAXIN) 500 MG tablet Take 1 tablet (500 mg total) by mouth 2 (two) times daily. Patient not taking: Reported on 04/24/2019 12/24/18   Bethel Born, PA-C  naproxen (NAPROSYN) 500 MG tablet Take 1 tablet (500 mg total) by mouth 2 (two) times daily. Patient not taking: Reported on 04/24/2019 12/24/18   Bethel Born, PA-C  valACYclovir (VALTREX) 1000 MG tablet Take 1 tablet (1,000 mg total) by mouth 3 (three) times daily. Patient not taking: Reported on 04/24/2019 12/24/14   Raelyn Ensign, PA    Allergies    Shellfish allergy  Review of Systems   Review of Systems  Unable to perform ROS: Mental status change    Physical Exam Updated Vital Signs BP (!) 130/93 (BP Location: Right Arm)   Pulse 80   Temp (!) 96.4 F (35.8 C) (Rectal)   Resp 15   SpO2 100%   Physical Exam Vitals and nursing note reviewed.  Constitutional:  Appearance: He is well-developed and well-nourished.     Comments: He is asleep. Will open eyes if I shake him but then does not answer questions/follow commands and falls back asleep. He is in four-point restraints  HENT:     Head: Normocephalic and atraumatic.     Right Ear: External ear normal.     Left Ear: External ear normal.     Nose: Nose normal.  Eyes:     General:        Right eye: No discharge.        Left eye: No discharge.     Comments: Pupils are dilated bilaterally  Cardiovascular:     Rate and Rhythm: Normal rate and regular rhythm.     Heart sounds: Normal heart sounds.  Pulmonary:     Effort:  Pulmonary effort is normal.     Breath sounds: Normal breath sounds.  Abdominal:     General: There is no distension.     Palpations: Abdomen is soft.     Tenderness: There is no abdominal tenderness.  Musculoskeletal:        General: No edema.     Cervical back: Neck supple. No rigidity. Normal range of motion.  Skin:    General: Skin is warm and dry.  Psychiatric:        Mood and Affect: Mood is not anxious.     ED Results / Procedures / Treatments   Labs (all labs ordered are listed, but only abnormal results are displayed) Labs Reviewed  URINALYSIS, ROUTINE W REFLEX MICROSCOPIC - Abnormal; Notable for the following components:      Result Value   Color, Urine STRAW (*)    All other components within normal limits  RAPID URINE DRUG SCREEN, HOSP PERFORMED - Abnormal; Notable for the following components:   Benzodiazepines POSITIVE (*)    Amphetamines POSITIVE (*)    All other components within normal limits  COMPREHENSIVE METABOLIC PANEL - Abnormal; Notable for the following components:   Glucose, Bld 103 (*)    Total Protein 8.2 (*)    All other components within normal limits  ACETAMINOPHEN LEVEL - Abnormal; Notable for the following components:   Acetaminophen (Tylenol), Serum <10 (*)    All other components within normal limits  SALICYLATE LEVEL - Abnormal; Notable for the following components:   Salicylate Lvl <7.0 (*)    All other components within normal limits  CBG MONITORING, ED - Abnormal; Notable for the following components:   Glucose-Capillary 66 (*)    All other components within normal limits  RESP PANEL BY RT-PCR (FLU A&B, COVID) [id number]  ETHANOL  CBC WITH DIFFERENTIAL/PLATELET    EKG EKG Interpretation  Date/Time:  Tuesday October 21 2020 12:02:15 EST Ventricular Rate:  85 PR Interval:    QRS Duration: 87 QT Interval:  368 QTC Calculation: 438 R Axis:   88 Text Interpretation: Sinus rhythm ST elev, probable normal early repol pattern No old  tracing to compare Confirmed by Pricilla Loveless [phone]) on 10/21/2020 12:47:31 PM   Radiology CT Head Wo Contrast  Result Date: 10/21/2020 CLINICAL DATA:  Altered mental status EXAM: CT HEAD WITHOUT CONTRAST TECHNIQUE: Contiguous axial images were obtained from the base of the skull through the vertex without intravenous contrast. COMPARISON:  CT head dated 07/09/2013. FINDINGS: Brain: No evidence of acute infarction, hemorrhage, hydrocephalus, extra-axial collection or mass lesion/mass effect. Vascular: No hyperdense vessel or unexpected calcification. Skull: Normal. Negative for fracture or focal lesion. Sinuses/Orbits: No acute finding.  Other: None. IMPRESSION: No acute intracranial process. Electronically Signed   By: Romona Curls M.D.   On: 10/21/2020 13:16    Procedures .Critical Care Performed by: Pricilla Loveless, MD Authorized by: Pricilla Loveless, MD   Critical care provider statement:    Critical care time (minutes):  35   Critical care time was exclusive of:  Separately billable procedures and treating other patients   Critical care was necessary to treat or prevent imminent or life-threatening deterioration of the following conditions: acute psychosis.   Critical care was time spent personally by me on the following activities:  Discussions with consultants, evaluation of patient's response to treatment, examination of patient, ordering and performing treatments and interventions, ordering and review of laboratory studies, ordering and review of radiographic studies, pulse oximetry, re-evaluation of patient's condition, obtaining history from patient or surrogate and review of old charts     Medications Ordered in ED Medications  sodium chloride 0.9 % bolus 1,000 mL (1,000 mLs Intravenous Bolus 10/21/20 1307)  LORazepam (ATIVAN) injection 2 mg (2 mg Intravenous Given 10/21/20 1431)    ED Course  I have reviewed the triage vital signs and the nursing notes.  Pertinent labs &  imaging results that were available during my care of the patient were reviewed by me and considered in my medical decision making (see chart for details).    MDM Rules/Calculators/A&P                          Initially, history quite limited.  The patient has progressively become more and more awake and eventually is able to tell me that he was given methamphetamine at some point last night.  I think this would explain a lot of his symptoms.  However he is still does not seem like he is back to normal or completely oriented and seems to be intermittently agitated.  Still a threat to staff and so he was given IV Ativan.  Involuntarily committed and will get psychiatry to evaluate.  The patient has been placed in psychiatric observation due to the need to provide a safe environment for the patient while obtaining psychiatric consultation and evaluation, as well as ongoing medical and medication management to treat the patient's condition.  The patient has been placed under full IVC at this time.  Final Clinical Impression(s) / ED Diagnoses Final diagnoses:  Acute psychosis [facility])    Rx / DC Orders ED Discharge Orders    None       Pricilla Loveless, MD 10/21/20 1620

## 2020-10-22 ENCOUNTER — Encounter (HOSPITAL_COMMUNITY): Payer: Self-pay | Admitting: Registered Nurse

## 2020-10-22 DIAGNOSIS — F23 Brief psychotic disorder: Secondary | ICD-10-CM

## 2020-10-22 DIAGNOSIS — F19959 Other psychoactive substance use, unspecified with psychoactive substance-induced psychotic disorder, unspecified: Secondary | ICD-10-CM

## 2020-10-22 DIAGNOSIS — Z638 Other specified problems related to primary support group: Secondary | ICD-10-CM

## 2020-10-22 NOTE — Consult Note (Signed)
Telepsych Consultation   Reason for Consult:  Delusional, suicidal Referring Physician:  Pricilla Loveless, MD Location of Patient: [facility] ED Location of Provider: Other: [facility]  Patient Identification: Bruce Carlson MRN:  [id number] Principal Diagnosis: Substance-induced psychotic disorder [facility]) Diagnosis:  Principal Problem:   Substance-induced psychotic disorder (HCC) Active Problems:   Family discord   Total Time spent with patient: 30 minutes  Subjective:   Bruce Carlson is a 35 y.o. male patient admitted to [facility] ED after being brought in by EMS/police with complaints with complaints of patient not acting like himself, aggressive behavior so girlfriend called police  HPI:  Bruce Carlson, 57 y.o., male patient seen via tele health by this provider, consulted with Dr. Lucianne Muss; and chart reviewed on 10/22/20.  On evaluation Bruce Carlson reports he is nor really sure why he was brought to the hospital.  States he remembers doing "some oxy and Adderall."  "me and my wife argue she always say I go out and I cheat.  I work 9 in morning to 9 at night most days.  I go out but I don't cheat.  I'm not that person.  I get tired of people say this and that when I don't.  I good to her; I spend money.  I do everything I good to her.  I guess I mad, upset an I remember taking oxy and Adderall.  I told last night that I wanted to kill myself; but I don't want to kill myself.  I don't want to die I'm not that person."  Patient states he has no psychiatric history and has never tried to kill himself.  Patient denies violence and self harming behavior.  States he lives with his wife and 4 children ages 65, 27. And twins ages 26.5 yrs old.  Patient gave permission to speak to his wife for collateral information but did not have her number and the number in emergency contact is incorrect.  Patient willing to go to outpatient psychiatric services for therapy.   During evaluation Bruce Carlson is sitting on side of bed eating his breakfast  in no acute distress.  He is alert, oriented x 4, calm and cooperative.  Patient doesn't appear to be confused and is answering questions and talking appropriately.  Patient primary language is [place] but speaks good [place] and no interpreter is needed.  Patients mood is euthymic with congruent affect.  He does not appear to be responding to internal/external stimuli or delusional thoughts and denies suicidal/self-harm/homicidal ideation, psychosis, and paranoia.  Patient answered question appropriately.  Past Psychiatric History: Denies prior psychiatric history and states this is the first time he has done any drugs  Risk to Self:  No Risk to Others:  NO Prior Inpatient Therapy:  No Prior Outpatient Therapy:  No  Past Medical History:  Past Medical History:  Diagnosis Date  . Allergy   . Depression    History reviewed. No pertinent surgical history. Family History:  Family History  Problem Relation Age of Onset  . Hypertension Mother   . Hyperlipidemia Mother   . Hypertension Father   . Stroke Father   . Hyperlipidemia Father    Family Psychiatric  History: Denies Social History:  Social History   Substance and Sexual Activity  Alcohol Use No     Social History   Substance and Sexual Activity  Drug Use No    Social History   Socioeconomic History  . Marital status: Single  Spouse name: Not on file  . Number of children: Not on file  . Years of education: Not on file  . Highest education level: Not on file  Occupational History  . Not on file  Tobacco Use  . Smoking status: Never Smoker  . Smokeless tobacco: Never Used  Vaping Use  . Vaping Use: Never used  Substance and Sexual Activity  . Alcohol use: No  . Drug use: No  . Sexual activity: Yes  Other Topics Concern  . Not on file  Social History Narrative  . Not on file   Social Determinants of Health   Financial Resource Strain: Not on file  Food Insecurity: Not on file  Transportation Needs:  Not on file  Physical Activity: Not on file  Stress: Not on file  Social Connections: Not on file   Additional Social History:    Allergies:   Allergies  Allergen Reactions  . Shellfish Allergy     Labs:  Results for orders placed or performed during the hospital encounter of 10/21/20 (from the past 48 hour(s))  Comprehensive metabolic panel     Status: Abnormal   Collection Time: 10/21/20 11:56 AM  Result Value Ref Range   Sodium 138 135 - 145 mmol/L   Potassium 3.7 3.5 - 5.1 mmol/L   Chloride 101 98 - 111 mmol/L   CO2 29 22 - 32 mmol/L   Glucose, Bld 103 (H) 70 - 99 mg/dL    Comment: Glucose reference range applies only to samples taken after fasting for at least 8 hours.   BUN 8 6 - 20 mg/dL   Creatinine, Ser 0.24 0.61 - 1.24 mg/dL   Calcium 9.3 8.9 - 09.7 mg/dL   Total Protein 8.2 (H) 6.5 - 8.1 g/dL   Albumin 4.6 3.5 - 5.0 g/dL   AST 20 15 - 41 U/L   ALT 24 0 - 44 U/L   Alkaline Phosphatase 60 38 - 126 U/L   Total Bilirubin 0.7 0.3 - 1.2 mg/dL   GFR, Estimated >35 >32 mL/min    Comment: (NOTE) Calculated using the CKD-EPI Creatinine Equation (2021)    Anion gap 8 5 - 15    Comment: Performed at [facility], [address] [place], [place] [address]  CBC with Differential     Status: None   Collection Time: 10/21/20 11:56 AM  Result Value Ref Range   WBC 4.8 4.0 - 10.5 K/uL   RBC 5.08 4.22 - 5.81 MIL/uL   Hemoglobin 14.4 13.0 - 17.0 g/dL   HCT 68.3 41.9 - 62.2 %   MCV 83.9 80.0 - 100.0 fL   MCH 28.3 26.0 - 34.0 pg   MCHC 33.8 30.0 - 36.0 g/dL   RDW 29.7 98.9 - 21.1 %   Platelets 255 150 - 400 K/uL   nRBC 0.0 0.0 - 0.2 %   Neutrophils Relative % 47 %   Neutro Abs 2.2 1.7 - 7.7 K/uL   Lymphocytes Relative 41 %   Lymphs Abs 2.0 0.7 - 4.0 K/uL   Monocytes Relative 10 %   Monocytes Absolute 0.5 0.1 - 1.0 K/uL   Eosinophils Relative 2 %   Eosinophils Absolute 0.1 0.0 - 0.5 K/uL   Basophils Relative 0 %   Basophils Absolute 0.0 0.0 - 0.1  K/uL   Immature Granulocytes 0 %   Abs Immature Granulocytes 0.01 0.00 - 0.07 K/uL    Comment: Performed at [facility], [address] [place],   [address]  Acetaminophen level     Status: Abnormal   Collection Time: 10/21/20 12:19 PM  Result Value Ref Range   Acetaminophen (Tylenol), Serum <10 (L) 10 - 30 ug/mL    Comment: (NOTE) Therapeutic concentrations vary significantly. A range of 10-30 ug/mL  may be an effective concentration for many patients. However, some  are best treated at concentrations outside of this range. Acetaminophen concentrations >150 ug/mL at 4 hours after ingestion  and >50 ug/mL at 12 hours after ingestion are often associated with  toxic reactions.  Performed at [facility], [address], [place], [place] [address]   Ethanol     Status: None   Collection Time: 10/21/20 12:19 PM  Result Value Ref Range   Alcohol, Ethyl (B) <10 <10 mg/dL    Comment: (NOTE) Lowest detectable limit for serum alcohol is 10 mg/dL.  For medical purposes only. Performed at [facility], [address], [place], [place] [address]   Salicylate level     Status: Abnormal   Collection Time: 10/21/20 12:19 PM  Result Value Ref Range   Salicylate Lvl <7.0 (L) 7.0 - 30.0 mg/dL    Comment: Performed at [facility], [address] [place], [place] [address]  Resp Panel by RT-PCR (Flu A&B, Covid) Nasopharyngeal Swab     Status: None   Collection Time: 10/21/20 12:19 PM   Specimen: Nasopharyngeal Swab; Nasopharyngeal(NP) swabs in vial transport medium  Result Value Ref Range   SARS Coronavirus 2 by RT PCR NEGATIVE NEGATIVE    Comment: (NOTE) SARS-CoV-2 target nucleic acids are NOT DETECTED.  The SARS-CoV-2 RNA is generally detectable in upper respiratory specimens during the acute phase of infection. The lowest concentration of SARS-CoV-2 viral copies this assay can detect is 138 copies/mL.  A negative result does not preclude SARS-Cov-2 infection and should not be used as the sole basis for treatment or other patient management decisions. A negative result may occur with  improper specimen collection/handling, submission of specimen other than nasopharyngeal swab, presence of viral mutation(s) within the areas targeted by this assay, and inadequate number of viral copies(<138 copies/mL). A negative result must be combined with clinical observations, patient history, and epidemiological information. The expected result is Negative.  Fact Sheet for Patients:  BloggerCourse.com  Fact Sheet for Healthcare Providers:  SeriousBroker.it  This test is no t yet approved or cleared by the [place] FDA and  has been authorized for detection and/or diagnosis of SARS-CoV-2 by FDA under an Emergency Use Authorization (EUA). This EUA will remain  in effect (meaning this test can be used) for the duration of the COVID-19 declaration under Section 564(b)(1) of the Act, 21 U.S.C.section 360bbb-3(b)(1), unless the authorization is terminated  or revoked sooner.       Influenza A by PCR NEGATIVE NEGATIVE   Influenza B by PCR NEGATIVE NEGATIVE    Comment: (NOTE) The Xpert Xpress SARS-CoV-2/FLU/RSV plus assay is intended as an aid in the diagnosis of influenza from Nasopharyngeal swab specimens and should not be used as a sole basis for treatment. Nasal washings and aspirates are unacceptable for Xpert Xpress SARS-CoV-2/FLU/RSV testing.  Fact Sheet for Patients: BloggerCourse.com  Fact Sheet for Healthcare Providers: SeriousBroker.it  This test is not yet approved or cleared by the [place] FDA and has been authorized for detection and/or diagnosis of SARS-CoV-2 by FDA under an Emergency Use Authorization (EUA). This EUA will remain in effect (meaning this test can be used)  for the duration of the COVID-19 declaration under Section 564(b)(1) of the Act, 21 U.S.C. section 360bbb-3(b)(1), unless the authorization is terminated or revoked.  Performed at [facility], [address], [place], [place] [address]   CBG monitoring, ED     Status: Abnormal   Collection Time: 10/21/20  1:17 PM  Result Value Ref Range   Glucose-Capillary 66 (L) 70 - 99 mg/dL    Comment: Glucose reference range applies only to samples taken after fasting for at least 8 hours.  Urinalysis, Routine w reflex microscopic     Status: Abnormal   Collection Time: 10/21/20  2:07 PM  Result Value Ref Range   Color, Urine STRAW (A) YELLOW   APPearance CLEAR CLEAR   Specific Gravity, Urine 1.006 1.005 - 1.030   pH 7.0 5.0 - 8.0   Glucose, UA NEGATIVE NEGATIVE mg/dL   Hgb urine dipstick NEGATIVE NEGATIVE   Bilirubin Urine NEGATIVE NEGATIVE   Ketones, ur NEGATIVE NEGATIVE mg/dL   Protein, ur NEGATIVE NEGATIVE mg/dL   Nitrite NEGATIVE NEGATIVE   Leukocytes,Ua NEGATIVE NEGATIVE    Comment: Performed at [facility], [address], [place], [place] [address]  Urine rapid drug screen (hosp performed)     Status: Abnormal   Collection Time: 10/21/20  2:07 PM  Result Value Ref Range   Opiates NONE DETECTED NONE DETECTED   Cocaine NONE DETECTED NONE DETECTED   Benzodiazepines POSITIVE (A) NONE DETECTED   Amphetamines POSITIVE (A) NONE DETECTED   Tetrahydrocannabinol NONE DETECTED NONE DETECTED   Barbiturates NONE DETECTED NONE DETECTED    Comment: (NOTE) DRUG SCREEN FOR MEDICAL PURPOSES ONLY.  IF CONFIRMATION IS NEEDED FOR ANY PURPOSE, NOTIFY LAB WITHIN 5 DAYS.  LOWEST DETECTABLE LIMITS FOR URINE DRUG SCREEN Drug Class                     Cutoff (ng/mL) Amphetamine and metabolites    1000 Barbiturate and metabolites    200 Benzodiazepine                 200 Tricyclics and metabolites     300 Opiates and metabolites        300 Cocaine and  metabolites        300 THC                            50 Performed at [facility], [address], [place], [place] [address]   CBG monitoring, ED     Status: Abnormal   Collection Time: 10/21/20  5:03 PM  Result Value Ref Range   Glucose-Capillary 66 (L) 70 - 99 mg/dL    Comment: Glucose reference range applies only to samples taken after fasting for at least 8 hours.    Medications:  No current facility-administered medications for this encounter.   No current outpatient medications on file.    Musculoskeletal: Strength & Muscle Tone: within normal limits Gait & Station: normal Patient leans: N/A  Psychiatric Specialty Exam: Physical Exam Vitals and nursing note reviewed. Chaperone present: Sitter at bedside.  Constitutional:      General: He is not in acute distress.    Appearance: Normal appearance. He is not ill-appearing.  Cardiovascular:     Rate and Rhythm: Normal rate.  Pulmonary:     Effort: Pulmonary effort is normal.  Musculoskeletal:        General: Normal range of motion.  Cervical back: Normal range of motion.  Neurological:     Mental Status: He is alert and oriented to person, place, and time.  Psychiatric:        Attention and Perception: Attention and perception normal. He does not perceive auditory or visual hallucinations.        Mood and Affect: Mood and affect normal.        Speech: Speech normal.        Behavior: Behavior normal. Behavior is cooperative.        Thought Content: Thought content normal. Thought content is not paranoid or delusional. Thought content does not include homicidal or suicidal ideation.        Cognition and Memory: Cognition and memory normal.        Judgment: Judgment normal.     Review of Systems  Constitutional: Negative.   HENT: Negative.   Eyes: Negative.   Respiratory: Negative.   Cardiovascular: Negative.   Gastrointestinal: Negative.   Genitourinary: Negative.   Musculoskeletal:  Negative.   Skin: Negative.   Neurological: Negative.   Hematological: Negative.   Psychiatric/Behavioral: Negative for agitation, behavioral problems, confusion, self-injury, sleep disturbance and suicidal ideas. The patient is not nervous/anxious.        Patient states "I done some oxy and Adderall and I don't usually do that stuff but was just fed up."  Reports he doesn't remember what happened to get him brought to the hospital    Blood pressure 113/86, pulse 95, temperature 98.5 F (36.9 C), temperature source Oral, resp. rate 16, SpO2 98 %.There is no height or weight on file to calculate BMI.  General Appearance: Casual  Eye Contact:  Good  Speech:  Clear and Coherent and Normal Rate  Volume:  Normal  Mood:  Euthymic  Affect:  Appropriate and Congruent  Thought Process:  Coherent, Goal Directed and Descriptions of Associations: Intact  Orientation:  Full (Time, Place, and Person)  Thought Content:  WDL  Suicidal Thoughts:  No  Homicidal Thoughts:  No  Memory:  Recent;   Good Remote;   Good  Judgement:  Intact  Insight:  Present  Psychomotor Activity:  Normal  Concentration:  Concentration: Good and Attention Span: Good  Recall:  Fair  Fund of Knowledge:  Fair  Language:  Good  Akathisia:  No  Handed:  Right  AIMS (if indicated):     Assets:  Communication Skills Desire for Improvement Housing Physical Health Resilience Social Support Transportation  ADL's:  Intact  Cognition:  WNL  Sleep:      Treatment Plan Summary: Plan Psychiatrically clear with outpatient psychiatric resources  Disposition: No evidence of imminent risk to self or others at present.   Patient does not meet criteria for psychiatric inpatient admission. Supportive therapy provided about ongoing stressors. Discussed crisis plan, support from social network, calling 911, coming to the Emergency Department, and calling Suicide Hotline.  This service was provided via telemedicine using a 2-way,  interactive audio and video technology.  Names of all persons participating in this telemedicine service and their role in this encounter. Name: Assunta FoundShuvon Terriyah Westra Role: NP  Name: Dr. Nelly RoutArchana Kumar Role: Psychiatrist  Name: Floydene FlockBlo Rudie Role: Patient  Name: Dr. Derwood KaplanAnkit Nanavati Role: Lucien MonsWL EDP sent a secure message informing:  Patient has been seen and psychiatrically cleared.  Behavioral health coordinator will give resources for outpatient psychiatric services     Jymir Dunaj, NP 10/22/2020 10:52 AM

## 2020-10-22 NOTE — BH Assessment (Addendum)
[facility] Assessment Progress Note  Per Shuvon Rankin, NP, this pt does not require psychiatric hospitalization at this time.  Pt presents under IVC initiated by EDP Pricilla Loveless, MD which has been rescinded by EDP Derwood Kaplan, MD.  Pt is psychiatrically cleared.  Discharge instructions include referral information for [facility].  Dr Rhunette Croft and pt's nurse, Waynetta Sandy, have been notified.  Doylene Canning, MA Triage Specialist [phone]

## 2020-10-22 NOTE — Discharge Instructions (Signed)
For your behavioral health needs, you are advised to follow up with [facility]:      [facility]      [address]      [place], [place] [address]      [phone]      They offer psychiatry/medication management, therapy and substance abuse treatment.  New patients are being seen in their walk-in clinic.  Walk-in hours are Monday - Thursday from 8:00 am - 11:00 am for psychiatry, and Friday from 1:00 pm - 4:00 pm for therapy.  Walk-in patients are seen on a first come, first served basis, so try to arrive as early as possible for the best chance of being seen the same day.  

## 2020-10-22 NOTE — ED Notes (Signed)
Pt DC off unit per provider.  Pt off unit in police  Custody.

## 2020-10-22 NOTE — ED Notes (Signed)
Pt ws transported to jail, [place] personal called requesting English copy of AVS. Printed copy faxed to jail as requested.
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
## 2021-09-06 ENCOUNTER — Other Ambulatory Visit: Payer: Self-pay

## 2021-09-06 ENCOUNTER — Emergency Department (HOSPITAL_COMMUNITY)
Admission: EM | Admit: 2021-09-06 | Discharge: 2021-09-06 | Disposition: A | Discharge status: Home / Self Care | Payer: Self-pay | Attending: Emergency Medicine | Admitting: Emergency Medicine

## 2021-09-06 ENCOUNTER — Emergency Department (HOSPITAL_COMMUNITY): Payer: Self-pay

## 2021-09-06 DIAGNOSIS — R0789 Other chest pain: Secondary | ICD-10-CM | POA: Insufficient documentation

## 2021-09-06 LAB — TROPONIN I (HIGH SENSITIVITY)
Troponin I (High Sensitivity): <2 ng/L (ref <18)
Troponin I (High Sensitivity): 4 ng/L (ref <18)

## 2021-09-06 LAB — COMPREHENSIVE METABOLIC PANEL
ALT: 38 U/L (ref 0–44)
AST: 31 U/L (ref 15–41)
Albumin: 4.1 g/dL (ref 3.5–5.0)
Alkaline Phosphatase: 66 U/L (ref 38–126)
Anion gap: 8 (ref 5–15)
BUN: 7 mg/dL (ref 6–20)
CO2: 26 mmol/L (ref 22–32)
Calcium: 8.9 mg/dL (ref 8.9–10.3)
Chloride: 102 mmol/L (ref 98–111)
Creatinine, Ser: 0.91 mg/dL (ref 0.61–1.24)
GFR, Estimated: >60 mL/min (ref >60)
Glucose, Bld: 107 mg/dL — ABNORMAL HIGH (ref 70–99)
Potassium: 3.7 mmol/L (ref 3.5–5.1)
Sodium: 136 mmol/L (ref 135–145)
Total Bilirubin: 0.4 mg/dL (ref 0.3–1.2)
Total Protein: 6.8 g/dL (ref 6.5–8.1)

## 2021-09-06 LAB — CBC
HCT: 40.4 % (ref 39.0–52.0)
Hemoglobin: 14 g/dL (ref 13.0–17.0)
MCH: 29.2 pg (ref 26.0–34.0)
MCHC: 34.7 g/dL (ref 30.0–36.0)
MCV: 84.2 fL (ref 80.0–100.0)
Platelets: 269 10*3/uL (ref 150–400)
RBC: 4.8 MIL/uL (ref 4.22–5.81)
RDW: 11.8 % (ref 11.5–15.5)
WBC: 7 10*3/uL (ref 4.0–10.5)
nRBC: 0 % (ref 0.0–0.2)

## 2021-09-06 MED ORDER — KETOROLAC TROMETHAMINE 15 MG/ML IJ SOLN
15.0000 mg | Freq: Once | INTRAMUSCULAR | Status: AC
Start: 2021-09-06 — End: 2021-09-06
  Administered 2021-09-06: 15 mg via INTRAVENOUS
  Filled 2021-09-06: qty 1

## 2021-09-06 MED ORDER — ALUM & MAG HYDROXIDE-SIMETH 200-200-20 MG/5ML PO SUSP
30.0000 mL | Freq: Once | ORAL | Status: AC
  Administered 2021-09-06: 30 mL via ORAL
  Filled 2021-09-06: qty 30

## 2021-09-06 MED ORDER — LIDOCAINE VISCOUS HCL 2 % MT SOLN
15.0000 mL | Freq: Once | OROMUCOSAL | Status: AC
Start: 2021-09-06 — End: 2021-09-06
  Administered 2021-09-06: 15 mL via ORAL
  Filled 2021-09-06: qty 15

## 2021-09-06 MED ORDER — NAPROXEN 500 MG PO TABS: 500.0000 mg | ORAL_TABLET | Freq: Two times a day (BID) | ORAL | 0 refills | Status: AC

## 2021-09-06 NOTE — Discharge Instructions (Addendum)
Take naproxen 2 times a day with meals.  Do not take other anti-inflammatories at the same time (Advil, Motrin, ibuprofen, Aleve). You may supplement with Tylenol if you need further pain control. Follow-up with cardiology office listed below for further evaluation. Return to the emergency room if develop increased pain, difficulty breathing, dizziness/lightheadedness, any new, worsening, or concerning symptoms

## 2021-09-06 NOTE — ED Triage Notes (Signed)
C/o left sided chest pain started today while working out; reported hx of HTN, and not taking meds at this time. EMS endorsed given NTG x 3 and ASA 324 mg; patient stated + pain relief.

## 2021-09-06 NOTE — ED Notes (Signed)
Patient transported to X-ray 

## 2021-09-06 NOTE — ED Provider Notes (Signed)
[facility] EMERGENCY DEPARTMENT Provider Note   CSN: [id number] Arrival date & time: 09/06/21  1155     History  Chief Complaint  Patient presents with   Chest Pain    Bruce Carlson is a 36 y.o. male presenting for evaluation of chest pain.  Patient states he was working out today when he felt a burning in his nose and a subsequent burning pain in his chest.  He has never had pain like this before.  It was constant, did not improve with rest or when he took a shower.  As such, EMS was called.  He was given nitro which he states did improve, but not completely resolve his pain.  He denies pleuritic symptoms.  No recent viral symptoms including fever, congestion, sore throat, cough.  He felt nauseous when he was having the more severe pain, had 1 episode of emesis.  He has no abdominal pain currently.  No leg pain or swelling.  He reports a history of high blood pressure, does not take medication for this.  Of note, patient states he recently started working out again.  Denies tobacco or alcohol use.  Occasionally uses Molly, none recently.  No family history of early cardiac death.  He has never been evaluated by cardiology previously  HPI     Home Medications Prior to Admission medications   Medication Sig Start Date End Date Taking? Authorizing Provider  naproxen (NAPROSYN) 500 MG tablet Take 1 tablet (500 mg total) by mouth 2 (two) times daily with a meal. 09/06/21  Yes Thos Matsumoto, PA-C      Allergies    Shellfish allergy    Review of Systems   Review of Systems  Cardiovascular:  Positive for chest pain.  All other systems reviewed and are negative.  Physical Exam Updated Vital Signs BP (!) 131/97    Pulse (!) 107    Temp 98.2 F (36.8 C) (Oral)    Resp 17    Ht 5\' 5"  (1.651 m)    Wt 63.5 kg    SpO2 100%    BMI 23.30 kg/m  Physical Exam Vitals and nursing note reviewed.  Constitutional:      General: He is not in acute distress.    Appearance: Normal  appearance.     Comments: Resting in the bed in NAD  HENT:     Head: Normocephalic and atraumatic.  Eyes:     Conjunctiva/sclera: Conjunctivae normal.     Pupils: Pupils are equal, round, and reactive to light.  Cardiovascular:     Rate and Rhythm: Normal rate and regular rhythm.     Pulses: Normal pulses.  Pulmonary:     Effort: Pulmonary effort is normal. No respiratory distress.     Breath sounds: Normal breath sounds. No wheezing.     Comments: Speaking in full sentences.  Clear lung sounds in all fields. Mild ttp of L upper anterior chest wall Chest:     Chest wall: Tenderness present.  Abdominal:     General: There is no distension.     Palpations: Abdomen is soft. There is no mass.     Tenderness: There is no abdominal tenderness. There is no guarding or rebound.  Musculoskeletal:        General: Normal range of motion.     Cervical back: Normal range of motion and neck supple.     Right lower leg: No edema.     Left lower leg: No edema.  Skin:    General: Skin is warm and dry.     Capillary Refill: Capillary refill takes less than 2 seconds.  Neurological:     Mental Status: He is alert and oriented to person, place, and time.  Psychiatric:        Mood and Affect: Mood and affect normal.        Speech: Speech normal.        Behavior: Behavior normal.    ED Results / Procedures / Treatments   Labs (all labs ordered are listed, but only abnormal results are displayed) Labs Reviewed  COMPREHENSIVE METABOLIC PANEL - Abnormal; Notable for the following components:      Result Value   Glucose, Bld 107 (*)    All other components within normal limits  CBC  TROPONIN I (HIGH SENSITIVITY)  TROPONIN I (HIGH SENSITIVITY)    EKG EKG Interpretation  Date/Time:  Sunday September 06 2021 12:05:48 EST Ventricular Rate:  102 PR Interval:  148 QRS Duration: 82 QT Interval:  340 QTC Calculation: 443 R Axis:   83 Text Interpretation: Sinus tachycardia ST elev, probable  normal early repol pattern Confirmed by Lajean Saver [phone]) on 09/06/2021 1:17:58 PM  Radiology DG Chest 2 View  Result Date: 09/06/2021 CLINICAL DATA:  Left chest pain. EXAM: CHEST - 2 VIEW COMPARISON:  April 24, 2019 FINDINGS: The heart size and mediastinal contours are within normal limits. Both lungs are clear. The visualized skeletal structures are unremarkable. IMPRESSION: No active cardiopulmonary disease. Electronically Signed   By: Abelardo Diesel M.D.   On: 09/06/2021 12:48    Procedures Procedures    Medications Ordered in ED Medications  ketorolac (TORADOL) 15 MG/ML injection 15 mg (15 mg Intravenous Given 09/06/21 1415)  alum & mag hydroxide-simeth (MAALOX/MYLANTA) 200-200-20 MG/5ML suspension 30 mL (30 mLs Oral Given 09/06/21 1415)    And  lidocaine (XYLOCAINE) 2 % viscous mouth solution 15 mL (15 mLs Oral Given 09/06/21 1415)    ED Course/ Medical Decision Making/ A&P                           Medical Decision Making   This patient presents to the ED for concern of chest pain. This involves an extensive number of treatment options, and is a complaint that carries with it a moderate risk of complications and morbidity.  The differential diagnosis includes MSK pain, anxiety, GERD, ACS, viral illness, pneumonia.   Co morbidities:  none    Lab Tests:  I ordered, and personally interpreted labs.  The pertinent results include: Normal electrolytes.  Normal white count.  Troponin negative x2.   Imaging Studies:  I ordered imaging studies including chest x-ray I independently visualized and interpreted imaging which showed no pneumonia or pneumothorax I agree with the radiologist interpretation   Medicines ordered:  I ordered medication including Toradol and GI cocktail for chest pain. Reevaluation of the patient after these medicines showed that the patient improved   Test Considered:  Doubt PE, pain is not pleuritic, patient with shortness of breath. Will hold on  CTA   Disposition:  After consideration of the diagnostic results and the patients response to treatment, I feel that the patent would benefit from symptomatic outpatient management.  Can follow-up with cardiology outpatient.  Discussed findings and plan with patient, who is agreeable.  At this time, patient appears safe for discharge.  Return precautions given.  Final Clinical Impression(s) / ED Diagnoses Final diagnoses:  Atypical chest pain    Rx / DC Orders ED Discharge Orders          Ordered    naproxen (NAPROSYN) 500 MG tablet  2 times daily with meals        09/06/21 1531              Chaquita Basques, PA-C 09/06/21 1535    Lajean Saver, MD 09/07/21 [phone]

## 2021-09-06 NOTE — ED Notes (Signed)
Pt asking for water.

## 2022-10-12 ENCOUNTER — Emergency Department (HOSPITAL_COMMUNITY)
Admission: EM | Admit: 2022-10-12 | Discharge: 2022-10-12 | Discharge status: Against Medical Advice | Payer: Self-pay | Attending: Emergency Medicine | Admitting: Emergency Medicine

## 2022-10-12 ENCOUNTER — Other Ambulatory Visit: Payer: Self-pay

## 2022-10-12 ENCOUNTER — Emergency Department (HOSPITAL_COMMUNITY): Payer: Self-pay

## 2022-10-12 DIAGNOSIS — R109 Unspecified abdominal pain: Secondary | ICD-10-CM | POA: Insufficient documentation

## 2022-10-12 DIAGNOSIS — R112 Nausea with vomiting, unspecified: Secondary | ICD-10-CM | POA: Insufficient documentation

## 2022-10-12 DIAGNOSIS — Z20822 Contact with and (suspected) exposure to covid-19: Secondary | ICD-10-CM | POA: Insufficient documentation

## 2022-10-12 DIAGNOSIS — R059 Cough, unspecified: Secondary | ICD-10-CM | POA: Insufficient documentation

## 2022-10-12 DIAGNOSIS — J029 Acute pharyngitis, unspecified: Secondary | ICD-10-CM | POA: Insufficient documentation

## 2022-10-12 DIAGNOSIS — Z5321 Procedure and treatment not carried out due to patient leaving prior to being seen by health care provider: Secondary | ICD-10-CM | POA: Insufficient documentation

## 2022-10-12 LAB — COMPREHENSIVE METABOLIC PANEL
ALT: 30 U/L (ref 0–44)
AST: 29 U/L (ref 15–41)
Albumin: 4 g/dL (ref 3.5–5.0)
Alkaline Phosphatase: 74 U/L (ref 38–126)
Anion gap: 9 (ref 5–15)
BUN: 8 mg/dL (ref 6–20)
CO2: 26 mmol/L (ref 22–32)
Calcium: 8.4 mg/dL — ABNORMAL LOW (ref 8.9–10.3)
Chloride: 104 mmol/L (ref 98–111)
Creatinine, Ser: 0.79 mg/dL (ref 0.61–1.24)
GFR, Estimated: >60 mL/min (ref >60)
Glucose, Bld: 101 mg/dL — ABNORMAL HIGH (ref 70–99)
Potassium: 3.9 mmol/L (ref 3.5–5.1)
Sodium: 139 mmol/L (ref 135–145)
Total Bilirubin: 0.3 mg/dL (ref 0.3–1.2)
Total Protein: 7.4 g/dL (ref 6.5–8.1)

## 2022-10-12 LAB — URINALYSIS, ROUTINE W REFLEX MICROSCOPIC
Bilirubin Urine: NEGATIVE
Glucose, UA: NEGATIVE mg/dL
Hgb urine dipstick: NEGATIVE
Ketones, ur: NEGATIVE mg/dL
Leukocytes,Ua: NEGATIVE
Nitrite: NEGATIVE
Protein, ur: NEGATIVE mg/dL
Specific Gravity, Urine: 1.001 — ABNORMAL LOW (ref 1.005–1.030)
pH: 8 (ref 5.0–8.0)

## 2022-10-12 LAB — CBC WITH DIFFERENTIAL/PLATELET
Abs Immature Granulocytes: 0 10*3/uL (ref 0.00–0.07)
Basophils Absolute: 0 10*3/uL (ref 0.0–0.1)
Basophils Relative: 0 %
Eosinophils Absolute: 0 10*3/uL (ref 0.0–0.5)
Eosinophils Relative: 1 %
HCT: 42.8 % (ref 39.0–52.0)
Hemoglobin: 14.3 g/dL (ref 13.0–17.0)
Immature Granulocytes: 0 %
Lymphocytes Relative: 37 %
Lymphs Abs: 1.3 10*3/uL (ref 0.7–4.0)
MCH: 28.2 pg (ref 26.0–34.0)
MCHC: 33.4 g/dL (ref 30.0–36.0)
MCV: 84.4 fL (ref 80.0–100.0)
Monocytes Absolute: 0.4 10*3/uL (ref 0.1–1.0)
Monocytes Relative: 12 %
Neutro Abs: 1.8 10*3/uL (ref 1.7–7.7)
Neutrophils Relative %: 50 %
Platelets: 225 10*3/uL (ref 150–400)
RBC: 5.07 MIL/uL (ref 4.22–5.81)
RDW: 11.9 % (ref 11.5–15.5)
WBC: 3.5 10*3/uL — ABNORMAL LOW (ref 4.0–10.5)
nRBC: 0 % (ref 0.0–0.2)

## 2022-10-12 LAB — GROUP A STREP BY PCR: Group A Strep by PCR: NOT DETECTED

## 2022-10-12 LAB — RESP PANEL BY RT-PCR (RSV, FLU A&B, COVID)  RVPGX2
Influenza A by PCR: NEGATIVE
Influenza B by PCR: NEGATIVE
Resp Syncytial Virus by PCR: NEGATIVE
SARS Coronavirus 2 by RT PCR: NEGATIVE

## 2022-10-12 LAB — LIPASE, BLOOD: Lipase: 28 U/L (ref 11–51)

## 2022-10-12 MED ORDER — ONDANSETRON HCL 4 MG/2ML IJ SOLN
4.0000 mg | Freq: Once | INTRAMUSCULAR | Status: DC
Start: 2022-10-12 — End: 2022-10-13

## 2022-10-12 NOTE — ED Provider Triage Note (Signed)
Emergency Medicine Provider Triage Evaluation Note  Bruce Carlson , a 37 y.o. male  was evaluated in triage.  Pt complains of cough, sore throat, nausea, lightheadedness, subjective fever, body aches for the last 2 weeks. No SOB or chest pain. Abd pain, cramping, with diarrhea for the last 3 days, 7-8 episodes per day. Vomiting for 1 week, worse since yesterday with inability to tolerate PO. No hematemesis, melena, hematochezia. Taking DayQuil and NyQuil at home with no relief. Used old Percocet and Xanax as well at home yesterday with no relief.   Review of Systems  Positive: See HPI Negative: See HPI  Physical Exam  BP (!) 134/102 (BP Location: Right Arm)   Pulse 98   Temp 97.7 F (36.5 C) (Oral)   Resp 18   Ht 5' 5"$  (1.651 m)   Wt 64 kg   SpO2 98%   BMI 23.48 kg/m  Gen:   Awake, no distress   Resp:  Normal effort LCTA MSK:   Moves extremities without difficulty, no LE edema  Other:  Abdomen soft and nontender without rebound or guarding, regular rate and rhythm, nonfocal neurological exam  Medical Decision Making  Medically screening exam initiated at 2:43 PM.  Appropriate orders placed.  Bruce Carlson was informed that the remainder of the evaluation will be completed by another provider, this initial triage assessment does not replace that evaluation, and the importance of remaining in the ED until their evaluation is complete.     Suzzette Righter, PA-C 10/12/22 1452

## 2022-10-12 NOTE — ED Triage Notes (Addendum)
Pt BIB EMS from home c/o abd pain, nausea and vomiting x this morning. 22ga left hand 4mg  Zofran given en route

## 2023-04-29 ENCOUNTER — Emergency Department (HOSPITAL_COMMUNITY): Payer: 59

## 2023-04-29 ENCOUNTER — Emergency Department (HOSPITAL_COMMUNITY)
Admission: EM | Admit: 2023-04-29 | Discharge: 2023-04-29 | Disposition: A | Discharge status: Home / Self Care | Payer: 59 | Attending: Emergency Medicine | Admitting: Emergency Medicine

## 2023-04-29 ENCOUNTER — Encounter (HOSPITAL_COMMUNITY): Payer: Self-pay

## 2023-04-29 DIAGNOSIS — M549 Dorsalgia, unspecified: Secondary | ICD-10-CM | POA: Diagnosis not present

## 2023-04-29 DIAGNOSIS — M5441 Lumbago with sciatica, right side: Secondary | ICD-10-CM | POA: Diagnosis not present

## 2023-04-29 DIAGNOSIS — G8929 Other chronic pain: Secondary | ICD-10-CM

## 2023-04-29 DIAGNOSIS — M545 Low back pain, unspecified: Secondary | ICD-10-CM | POA: Diagnosis not present

## 2023-04-29 DIAGNOSIS — M25551 Pain in right hip: Secondary | ICD-10-CM | POA: Diagnosis not present

## 2023-04-29 DIAGNOSIS — W19XXXA Unspecified fall, initial encounter: Secondary | ICD-10-CM | POA: Diagnosis not present

## 2023-04-29 DIAGNOSIS — M533 Sacrococcygeal disorders, not elsewhere classified: Secondary | ICD-10-CM | POA: Diagnosis not present

## 2023-04-29 DIAGNOSIS — M5136 Other intervertebral disc degeneration, lumbar region: Secondary | ICD-10-CM | POA: Diagnosis not present

## 2023-04-29 DIAGNOSIS — R200 Anesthesia of skin: Secondary | ICD-10-CM | POA: Insufficient documentation

## 2023-04-29 DIAGNOSIS — M25552 Pain in left hip: Secondary | ICD-10-CM | POA: Diagnosis not present

## 2023-04-29 DIAGNOSIS — R202 Paresthesia of skin: Secondary | ICD-10-CM | POA: Insufficient documentation

## 2023-04-29 MED ORDER — IBUPROFEN 400 MG PO TABS
600.0000 mg | ORAL_TABLET | Freq: Once | ORAL | Status: AC
  Administered 2023-04-29: 600 mg via ORAL
  Filled 2023-04-29: qty 1

## 2023-04-29 MED ORDER — LIDOCAINE 5 % EX PTCH
1.0000 | MEDICATED_PATCH | Freq: Once | CUTANEOUS | Status: DC
  Administered 2023-04-29: 1 via TRANSDERMAL

## 2023-04-29 MED ORDER — OXYCODONE-ACETAMINOPHEN 5-325 MG PO TABS: 1.0000 | ORAL_TABLET | Freq: Four times a day (QID) | ORAL | 0 refills | Status: AC | PRN

## 2023-04-29 MED ORDER — METHOCARBAMOL 500 MG PO TABS: 500.0000 mg | ORAL_TABLET | Freq: Two times a day (BID) | ORAL | 0 refills | Status: AC

## 2023-04-29 MED ORDER — OXYCODONE HCL 5 MG PO TABS
5.0000 mg | ORAL_TABLET | Freq: Once | ORAL | Status: AC
  Administered 2023-04-29: 5 mg via ORAL
  Filled 2023-04-29: qty 1

## 2023-04-29 MED ORDER — OXYCODONE-ACETAMINOPHEN 5-325 MG PO TABS
1.0000 | ORAL_TABLET | Freq: Once | ORAL | Status: AC
  Administered 2023-04-29: 1 via ORAL
  Filled 2023-04-29: qty 1

## 2023-04-29 MED ORDER — LIDOCAINE 5 % EX PTCH
1.0000 | MEDICATED_PATCH | CUTANEOUS | Status: DC
  Filled 2023-04-29 (×2): qty 1

## 2023-04-29 NOTE — ED Provider Notes (Signed)
Accepted handoff at shift change from [facility]. Please see prior provider note for full HPI.  Briefly: Patient is a 37 y.o. male who presents to the ER for low back pain x 2 days. Hx of coccygeal fracture in 2015. States pain today is the same as before. No precipitating trauma. Ran out of oxycodone prescription.  DDX/Plan: Pending x-rays. Anticipate d/c to home if imaging reassuring.   Physical Exam  BP 114/76   Pulse 90   Temp 97.8 F (36.6 C) (Axillary)   Resp 18   SpO2 100%   Physical Exam Vitals and nursing note reviewed.  Constitutional:      Appearance: Normal appearance.     Comments: Resting comfortably in exam bed  HENT:     Head: Normocephalic and atraumatic.  Eyes:     Conjunctiva/sclera: Conjunctivae normal.  Pulmonary:     Effort: Pulmonary effort is normal. No respiratory distress.  Skin:    General: Skin is warm and dry.  Neurological:     Mental Status: He is alert.  Psychiatric:        Mood and Affect: Mood normal.        Behavior: Behavior normal.     Results   DG Sacrum/Coccyx  Result Date: 04/29/2023 CLINICAL DATA:  37 year old male with low back and right hip pain, tailbone pain after lifting. EXAM: SACRUM AND COCCYX - 2+ VIEW COMPARISON:  Lumbar radiographs today. Sacrococcygeal radiographs 08/17/2014. FINDINGS: Bone mineralization is within normal limits. Sacral ala and SI joints appear symmetric and normal. Coccygeal segments are mildly more rotated anteriorly, but appear intact. No acute osseous abnormality identified. Visible bilateral pelvis appears intact. Negative visible bowel gas. IMPRESSION: Evidence of chronic trauma to the coccyx but no acute fracture identified about the sacrum or coccyx. Electronically Signed   By: Odessa Fleming M.D.   On: 04/29/2023 07:27   DG Lumbar Spine Complete  Result Date: 04/29/2023 CLINICAL DATA:  37 year old male with low back and right hip pain, tailbone pain after lifting. EXAM: LUMBAR SPINE - COMPLETE 4+  VIEW COMPARISON:  Lumbar radiographs 12/24/2018. FINDINGS: Normal lumbar segmentation. Bone mineralization is within normal limits. Chronic straightening and mild reversal of lumbar lordosis is stable since 2020. Stable vertebral height. Mild disc space loss of both L2-L3 and L4-L5, subtle progression. Elsewhere relatively preserved disc spaces. Mild lumbar endplate spurring is chronic. No pars fracture. Visible sacrum appears stable and intact. No acute osseous abnormality identified. Negative visible lung bases and bowel gas pattern. IMPRESSION: 1. No acute osseous abnormality identified in the lumbar spine. 2. Evidence of chronic disc degeneration at L2-L3 and L4-L5. Electronically Signed   By: Odessa Fleming M.D.   On: 04/29/2023 07:26    ED Course / MDM    Medical Decision Making Amount and/or Complexity of Data Reviewed Radiology: ordered.  Risk Prescription drug management.  PDMP reviewed.   X-rays without acute findings. Suspect acute flare of chronic pain. Requesting additional pain medication to assist in getting out of bed and into the car. Given 5 mg oxycodone. Will send prescription for percocet and recommend follow up with primary doctor for ongoing medication management. Pt discharged in stable condition and all questions answered.    Jeanella Flattery 04/29/23 4327    Jacalyn Lefevre, MD 04/29/23 [phone]

## 2023-04-29 NOTE — ED Triage Notes (Signed)
Pt BIB [facility] from with c/o lower back pain. Pt reports he fell out of car onto his back yesterday and has been having intermittent sharp pains to right lower back. Pt has been taking ibuprofen and oxycodone without relief. Hx of fractured tailbone in 2016.  EMS vitals BP 118/82 HR 82 RR 18 O2 98% room air

## 2023-04-29 NOTE — Discharge Instructions (Addendum)
You were seen in the emergency department today for low back pain.  We performed x-rays today which did not show any acute findings, I suspect that your pain is likely related to your prior trauma.  I have prescribed you a short course of pain medication.  If you are requiring more than this, I recommend following up with your primary doctor and discussing long-term medication management of your chronic pain.  Please follow up with your primary care provider regarding your visit today. If you do not have a primary care provider, you may reach out to [facility] at [phone] to establish with one and make your first appointment.  Continue to monitor how you're doing and return to the ER for new or worsening symptoms.

## 2023-04-29 NOTE — ED Provider Notes (Signed)
[place] EMERGENCY DEPARTMENT AT [facility] Provider Note   CSN: [id number] Arrival date & time: 04/29/23  8295     History  Chief Complaint  Patient presents with   Back Pain    Bruce Carlson is a 37 y.o. male with history of coccygeal fracture in 2015 who presents with concern for sudden onset pain 2 days ago.  States he went to his child's open house for school subsequently began a flare in his back pain.  Endorses pain in the central and right lower back with intermittent numbness and tingling down the right leg without weakness, saddle anesthesia, or skin changes.  States he takes Tylenol ibuprofen and oxycodone at home but does not take the oxycodone nightly because he does not like how it makes him feel. Poorly compliant with exam HPI    Home Medications Prior to Admission medications   Medication Sig Start Date End Date Taking? Authorizing Provider  naproxen (NAPROSYN) 500 MG tablet Take 1 tablet (500 mg total) by mouth 2 (two) times daily with a meal. 09/06/21   Caccavale, Sophia, PA-C      Allergies    Shellfish allergy    Review of Systems   Review of Systems  Musculoskeletal:  Positive for back pain. Negative for myalgias, neck pain and neck stiffness.    Physical Exam Updated Vital Signs BP 114/76   Pulse 90   Temp 97.8 F (36.6 C) (Axillary)   Resp 18   SpO2 100%  Physical Exam Vitals and nursing note reviewed.  Constitutional:      Appearance: He is not ill-appearing or toxic-appearing.  HENT:     Head: Normocephalic and atraumatic.     Mouth/Throat:     Mouth: Mucous membranes are moist.     Pharynx: No oropharyngeal exudate or posterior oropharyngeal erythema.  Eyes:     General:        Right eye: No discharge.        Left eye: No discharge.     Extraocular Movements: Extraocular movements intact.     Conjunctiva/sclera: Conjunctivae normal.     Pupils: Pupils are equal, round, and reactive to light.  Cardiovascular:     Rate and  Rhythm: Normal rate and regular rhythm.     Pulses: Normal pulses.     Heart sounds: Normal heart sounds. No murmur heard. Pulmonary:     Effort: Pulmonary effort is normal. No respiratory distress.     Breath sounds: Normal breath sounds. No wheezing or rales.  Abdominal:     General: Bowel sounds are normal. There is no distension.     Palpations: Abdomen is soft.     Tenderness: There is no abdominal tenderness. There is no guarding or rebound.  Musculoskeletal:        General: No deformity.     Cervical back: Neck supple.     Comments: Moving all extremity spontaneously without difficulty.  Normal DP pulses bilaterally.  Patient unwilling to range the left hip secondary to pain.  Full active range of motion of the right hip.  No midline tenderness palpation.  Skin:    General: Skin is warm and dry.     Capillary Refill: Capillary refill takes less than 2 seconds.  Neurological:     General: No focal deficit present.     Mental Status: He is alert and oriented to person, place, and time. Mental status is at baseline.  Psychiatric:        Mood  and Affect: Mood normal.     ED Results / Procedures / Treatments   Labs (all labs ordered are listed, but only abnormal results are displayed) Labs Reviewed - No data to display  EKG None  Radiology No results found.  Procedures Procedures    Medications Ordered in ED Medications  lidocaine (LIDODERM) 5 % 1 patch (0 patches Transdermal Hold 04/29/23 0619)  oxyCODONE-acetaminophen (PERCOCET/ROXICET) 5-325 MG per tablet 1 tablet (1 tablet Oral Given 04/29/23 0619)  ibuprofen (ADVIL) tablet 600 mg (600 mg Oral Given 04/29/23 2841)    ED Course/ Medical Decision Making/ A&P                                 Medical Decision Making 37 y/o male with back pain.   VS normal on intake, cardiopulmonary exam is benign. Abdominal exam is a normal. Tearful secondary to pain. Would not participate in neuro exam secondary to pain.    Amount and/or Complexity of Data Reviewed Radiology: ordered.  Risk Prescription drug management.    Care of this patient signed out to oncoming ED provider Lorin Roemhildt, PA-C  at time of shift change. All pertinent HPI, physical exam, and laboratory findings were discussed with them prior to my departure. Disposition of patient pending completion of workup, reevaluation, and clinical judgement of oncoming ED provider.    Final Clinical Impression(s) / ED Diagnoses Final diagnoses:  None    Rx / DC Orders ED Discharge Orders     None         Sherrilee Gilles 04/29/23 3244    Sabas Sous, MD 04/29/23 [phone]

## 2023-05-11 ENCOUNTER — Telehealth: Payer: Self-pay

## 2023-05-11 NOTE — Telephone Encounter (Signed)
Transition Care Management Unsuccessful Follow-up Telephone Call  Date of discharge and from where:  [facility] 8/23  Attempts:  1st Attempt  Reason for unsuccessful TCM follow-up call:  Missing or invalid number   [facility]  [facility] Guide, Phone: [phone] Website: [email or web address]
